# Patient Record
Sex: Female | Born: 1965 | Race: White | Hispanic: No | Marital: Married | Smoking: Former smoker
Health system: Southern US, Community
[De-identification: ages and names within clinical notes are randomized; demographics above are authoritative.]

## PROBLEM LIST (undated history)

## (undated) DIAGNOSIS — Z862 Personal history of diseases of the blood and blood-forming organs and certain disorders involving the immune mechanism: Secondary | ICD-10-CM

## (undated) DIAGNOSIS — R87629 Unspecified abnormal cytological findings in specimens from vagina: Secondary | ICD-10-CM

## (undated) DIAGNOSIS — D689 Coagulation defect, unspecified: Secondary | ICD-10-CM

## (undated) DIAGNOSIS — M1990 Unspecified osteoarthritis, unspecified site: Secondary | ICD-10-CM

## (undated) HISTORY — DX: Coagulation defect, unspecified: D68.9

## (undated) HISTORY — DX: Unspecified abnormal cytological findings in specimens from vagina: R87.629

## (undated) HISTORY — PX: TONSILLECTOMY: SUR1361

---

## 1990-05-14 HISTORY — PX: SPLENECTOMY: SUR1306

## 2008-04-02 ENCOUNTER — Inpatient Hospital Stay: Payer: Self-pay

## 2011-09-27 DIAGNOSIS — D6949 Other primary thrombocytopenia: Secondary | ICD-10-CM | POA: Insufficient documentation

## 2013-09-28 DIAGNOSIS — N924 Excessive bleeding in the premenopausal period: Secondary | ICD-10-CM | POA: Insufficient documentation

## 2015-03-17 DIAGNOSIS — F330 Major depressive disorder, recurrent, mild: Secondary | ICD-10-CM | POA: Insufficient documentation

## 2018-02-06 DIAGNOSIS — R5383 Other fatigue: Secondary | ICD-10-CM | POA: Insufficient documentation

## 2018-07-09 DIAGNOSIS — M25549 Pain in joints of unspecified hand: Secondary | ICD-10-CM | POA: Insufficient documentation

## 2018-07-09 DIAGNOSIS — L989 Disorder of the skin and subcutaneous tissue, unspecified: Secondary | ICD-10-CM | POA: Insufficient documentation

## 2018-07-09 DIAGNOSIS — N951 Menopausal and female climacteric states: Secondary | ICD-10-CM | POA: Insufficient documentation

## 2018-11-17 DIAGNOSIS — M189 Osteoarthritis of first carpometacarpal joint, unspecified: Secondary | ICD-10-CM | POA: Insufficient documentation

## 2018-11-17 DIAGNOSIS — M545 Low back pain, unspecified: Secondary | ICD-10-CM | POA: Insufficient documentation

## 2018-11-18 LAB — HIV ANTIBODY (ROUTINE TESTING W REFLEX): HIV 1&2 Ab, 4th Generation: NEGATIVE

## 2020-02-09 DIAGNOSIS — E559 Vitamin D deficiency, unspecified: Secondary | ICD-10-CM | POA: Insufficient documentation

## 2020-02-11 DIAGNOSIS — D72829 Elevated white blood cell count, unspecified: Secondary | ICD-10-CM | POA: Insufficient documentation

## 2020-02-29 ENCOUNTER — Telehealth: Payer: Self-pay

## 2020-02-29 NOTE — Telephone Encounter (Signed)
Called and left generic message for patient to call back to be scheduled 

## 2020-02-29 NOTE — Telephone Encounter (Signed)
Pt calling triage today, needing an appt for heavy vaginal bleeding and possibly being anemic. Can you please help get this pt scheduled

## 2020-03-14 LAB — HM MAMMOGRAPHY

## 2020-04-18 ENCOUNTER — Encounter: Payer: Self-pay | Admitting: Family Medicine

## 2020-04-18 ENCOUNTER — Ambulatory Visit (INDEPENDENT_AMBULATORY_CARE_PROVIDER_SITE_OTHER): Payer: BC Managed Care – PPO | Admitting: Family Medicine

## 2020-04-18 ENCOUNTER — Other Ambulatory Visit: Payer: Self-pay

## 2020-04-18 DIAGNOSIS — Z7689 Persons encountering health services in other specified circumstances: Secondary | ICD-10-CM | POA: Diagnosis not present

## 2020-04-18 DIAGNOSIS — D72829 Elevated white blood cell count, unspecified: Secondary | ICD-10-CM

## 2020-04-18 NOTE — Progress Notes (Signed)
Subjective:    Patient ID: Teresa Abbott, female    DOB: March 11, 1966, 54 y.o.   MRN: [id number]  Teresa Abbott is a 54 y.o. female presenting on 04/18/2020 for Establish Care   HPI  Teresa Abbott presents to clinic to establish as a new patient for primary care.  Previous PCP was at [place].  Records will be requested.  Past medical, family, and surgical history reviewed w/ pt.  She has no acute concerns today.  No flowsheet data found.  Social History   Tobacco Use  . Smoking status: Former Smoker    Years: 8.00    Quit date: 04/18/1990    Years since quitting: 30.0  . Smokeless tobacco: Never Used  Vaping Use  . Vaping Use: Never used  Substance Use Topics  . Alcohol use: Yes    Alcohol/week: 2.0 standard drinks    Types: 2 Standard drinks or equivalent per week  . Drug use: Not Currently    Types: Marijuana    Review of Systems  Constitutional: Negative.   HENT: Negative.   Eyes: Negative.   Respiratory: Negative.   Cardiovascular: Negative.   Gastrointestinal: Negative.   Endocrine: Negative.   Genitourinary: Negative.   Musculoskeletal: Negative.   Skin: Negative.   Allergic/Immunologic: Negative.   Neurological: Negative.   Hematological: Negative.   Psychiatric/Behavioral: Negative.    Per HPI unless specifically indicated above     Objective:    BP 113/60 (BP Location: Right Arm, Patient Position: Sitting, Cuff Size: Normal)   Pulse 76   Temp 98.7 F (37.1 C) (Oral)   Resp 17   Ht 5' 9.5" (1.765 m)   Wt 205 lb 9.6 oz (93.3 kg)   LMP 04/04/2020   SpO2 98%   BMI 29.93 kg/m   Wt Readings from Last 3 Encounters:  04/18/20 205 lb 9.6 oz (93.3 kg)    Physical Exam Vitals and nursing note reviewed.  Constitutional:      General: She is not in acute distress.    Appearance: Normal appearance. She is well-developed and well-groomed. She is not ill-appearing or toxic-appearing.  HENT:     Head: Normocephalic and atraumatic.     Nose:      Comments: Lesia Sago is in place, covering mouth and nose. Eyes:     General: Lids are normal. Vision grossly intact.        Right eye: No discharge.        Left eye: No discharge.     Extraocular Movements: Extraocular movements intact.     Conjunctiva/sclera: Conjunctivae normal.     Pupils: Pupils are equal, round, and reactive to light.  Cardiovascular:     Rate and Rhythm: Normal rate and regular rhythm.     Pulses: Normal pulses.     Heart sounds: Normal heart sounds. No murmur heard.  No friction rub. No gallop.   Pulmonary:     Effort: Pulmonary effort is normal. No respiratory distress.     Breath sounds: Normal breath sounds.  Skin:    General: Skin is warm and dry.     Capillary Refill: Capillary refill takes less than 2 seconds.  Neurological:     General: No focal deficit present.     Mental Status: She is alert and oriented to person, place, and time.  Psychiatric:        Attention and Perception: Attention and perception normal.        Mood and Affect: Mood and affect normal.  Speech: Speech normal.        Behavior: Behavior normal. Behavior is cooperative.        Thought Content: Thought content normal.        Cognition and Memory: Cognition and memory normal.        Judgment: Judgment normal.    No results found for this or any previous visit.    Assessment & Plan:   Problem List Items Addressed This Visit      Other   Encounter to establish care with new doctor    New patient establishment at [facility] for primary care.  Will request copies of previous medical records, reports last PAP testing was insufficient, will complete at CPE.  Reports she has received a referral to hematology with [facility] and has her first upcoming appointment late January 2022 for history of elevated WBC x 3.  Will RTC in 4 weeks for labs, CPE and PAP testing.         No orders of the defined types were placed in this encounter.  Follow up plan: Return in about 4 weeks  (around 05/16/2020) for CPE, PAP & Labs.   Charlaine Dalton, FNP Family Nurse Practitioner [facility] 04/18/2020, 1:00 PM

## 2020-04-18 NOTE — Assessment & Plan Note (Addendum)
New patient establishment at [facility] for primary care.  Will request copies of previous medical records, reports last PAP testing was insufficient, will complete at CPE.  Reports she has received a referral to hematology with [facility] and has her first upcoming appointment late January 2022 for history of elevated WBC x 3.  Will RTC in 4 weeks for labs, CPE and PAP testing.

## 2020-04-18 NOTE — Patient Instructions (Signed)
We will plan to see you back in 4 weeks for your physical, labs and PAP testing  You will receive a survey after today's visit either digitally by e-mail or paper by USPS mail. Your experiences and feedback matter to [place].  Please respond so we know how we are doing as we provide care for you.  Call us with any questions/concerns/needs.  It is my goal to be available to you for your health concerns.  Thanks for choosing me to be a partner in your healthcare needs!  Charlaine Dalton, FNP-C Family Nurse Practitioner [facility] Phone: [phone]

## 2020-05-19 ENCOUNTER — Ambulatory Visit (INDEPENDENT_AMBULATORY_CARE_PROVIDER_SITE_OTHER): Payer: BC Managed Care – PPO | Admitting: Family Medicine

## 2020-05-19 ENCOUNTER — Other Ambulatory Visit (HOSPITAL_COMMUNITY)
Admission: RE | Admit: 2020-05-19 | Discharge: 2020-05-19 | Disposition: A | Discharge status: Home / Self Care | Payer: BC Managed Care – PPO | Source: Ambulatory Visit | Attending: Family Medicine | Admitting: Family Medicine

## 2020-05-19 ENCOUNTER — Other Ambulatory Visit: Payer: Self-pay

## 2020-05-19 ENCOUNTER — Encounter: Payer: Self-pay | Admitting: Family Medicine

## 2020-05-19 VITALS — BP 106/45 | HR 59 | Temp 97.1°F | Resp 18 | Ht 69.5 in | Wt 208.4 lb

## 2020-05-19 DIAGNOSIS — Z0000 Encounter for general adult medical examination without abnormal findings: Secondary | ICD-10-CM | POA: Insufficient documentation

## 2020-05-19 DIAGNOSIS — Z124 Encounter for screening for malignant neoplasm of cervix: Secondary | ICD-10-CM | POA: Diagnosis not present

## 2020-05-19 DIAGNOSIS — Z1211 Encounter for screening for malignant neoplasm of colon: Secondary | ICD-10-CM | POA: Diagnosis not present

## 2020-05-19 DIAGNOSIS — G8929 Other chronic pain: Secondary | ICD-10-CM

## 2020-05-19 DIAGNOSIS — M25552 Pain in left hip: Secondary | ICD-10-CM

## 2020-05-19 DIAGNOSIS — N926 Irregular menstruation, unspecified: Secondary | ICD-10-CM | POA: Diagnosis not present

## 2020-05-19 NOTE — Progress Notes (Signed)
Subjective:    Patient ID: Teresa Abbott, female    DOB: April 01, 1966, 55 y.o.   MRN: [id number]  Teresa Abbott is a 55 y.o. female presenting on 05/19/2020 for Annual Exam   HPI  HEALTH MAINTENANCE:  Weight/BMI: Obese, BMI 30.33% Diet: Regular Seatbelt: Yes Sunscreen: Sometimes, no skin concerns PAP: Due, completed today  Mammogram: Completed 03/14/2020 - BI-RADS 1 Negative, Repeat 1 year with [facility] Colon cancer screening: Cologuard ordered HIV & Hep C Screening: Offered and declined  GC/CT: Offered and declined  Optometry: Yearly  Dentistry: Every 6 months   IMMUNIZATIONS: Tetanus: Unknown date of last vaccination, discussed Influenza: Up to date, 04/08/2020 COVID: Up to date, [place] 08/17/2019, 09/14/2019, and 04/08/2020  Depression screen PHQ 2/9 05/19/2020  Decreased Interest 0  Down, Depressed, Hopeless 0  PHQ - 2 Score 0    Past Medical History:  Diagnosis Date  . Abnormal vaginal Pap smear    Past Surgical History:  Procedure Laterality Date  . SPLENECTOMY  1992  . TONSILLECTOMY     Social History   Socioeconomic History  . Marital status: Married    Spouse name: Not on file  . Number of children: Not on file  . Years of education: Not on file  . Highest education level: Not on file  Occupational History  . Not on file  Tobacco Use  . Smoking status: Former Smoker    Years: 8.00    Quit date: 04/18/1990    Years since quitting: 30.1  . Smokeless tobacco: Never Used  Vaping Use  . Vaping Use: Never used  Substance and Sexual Activity  . Alcohol use: Yes    Alcohol/week: 2.0 standard drinks    Types: 2 Standard drinks or equivalent per week  . Drug use: Not Currently    Types: Marijuana  . Sexual activity: Not on file  Other Topics Concern  . Not on file  Social History Narrative  . Not on file   Social Determinants of Health   Financial Resource Strain: Not on file  Food Insecurity: Not on file  Transportation Needs: Not on file   Physical Activity: Not on file  Stress: Not on file  Social Connections: Not on file  Intimate Partner Violence: Not on file   Family History  Problem Relation Age of Onset  . Breast cancer Mother 20  . Heart disease Brother    Current Outpatient Medications on File Prior to Visit  Medication Sig  . Black Cohosh 20 MG TABS Take by mouth.  . Cholecalciferol (VITAMIN D3) 125 MCG (5000 UT) CAPS Take by mouth.  . Levonorgestrel-Ethinyl Estradiol (AMETHIA) 0.1-0.02 & 0.01 MG tablet Take 1 tablet by mouth at bedtime.  . meloxicam (MOBIC) 15 MG tablet Take 15 mg by mouth daily as needed.  . sertraline (ZOLOFT) 25 MG tablet Take by mouth.  . vitamin B-12 (CYANOCOBALAMIN) 500 MCG tablet Take 500 mcg by mouth daily.   No current facility-administered medications on file prior to visit.    Per HPI unless specifically indicated above     Objective:    BP (!) 106/45 (BP Location: Left Arm, Patient Position: Sitting, Cuff Size: Normal)   Pulse (!) 59   Temp (!) 97.1 F (36.2 C) (Temporal)   Resp 18   Ht 5' 9.5" (1.765 m)   Wt 208 lb 6.4 oz (94.5 kg)   LMP 05/09/2020   SpO2 100%   BMI 30.33 kg/m   Wt Readings from Last 3 Encounters:  05/19/20 208 lb 6.4 oz (94.5 kg)  04/18/20 205 lb 9.6 oz (93.3 kg)    Physical Exam Vitals and nursing note reviewed.  Constitutional:      General: She is not in acute distress.    Appearance: Normal appearance. She is well-developed and well-groomed. She is obese. She is not ill-appearing or toxic-appearing.  HENT:     Head: Normocephalic and atraumatic.     Right Ear: Tympanic membrane, ear canal and external ear normal. There is no impacted cerumen.     Left Ear: Tympanic membrane, ear canal and external ear normal. There is no impacted cerumen.     Nose: Nose normal. No congestion or rhinorrhea.     Mouth/Throat:     Lips: Pink.     Mouth: Mucous membranes are moist.     Pharynx: Oropharynx is clear. Uvula midline. No oropharyngeal exudate  or posterior oropharyngeal erythema.  Eyes:     General: Lids are normal. Vision grossly intact. No scleral icterus.       Right eye: No discharge.        Left eye: No discharge.     Extraocular Movements: Extraocular movements intact.     Conjunctiva/sclera: Conjunctivae normal.     Pupils: Pupils are equal, round, and reactive to light.  Neck:     Thyroid: No thyroid mass or thyromegaly.  Cardiovascular:     Rate and Rhythm: Normal rate and regular rhythm.     Pulses: Normal pulses.          Dorsalis pedis pulses are 2+ on the right side and 2+ on the left side.     Heart sounds: Normal heart sounds. No murmur heard. No friction rub. No gallop.   Pulmonary:     Effort: Pulmonary effort is normal. No respiratory distress.     Breath sounds: Normal breath sounds.  Abdominal:     General: Abdomen is flat. Bowel sounds are normal. There is no distension.     Palpations: Abdomen is soft. There is no hepatomegaly, splenomegaly or mass.     Tenderness: There is no abdominal tenderness. There is no guarding or rebound.     Hernia: No hernia is present.  Musculoskeletal:        General: Normal range of motion.     Cervical back: Normal range of motion and neck supple. No tenderness.     Right hip: Normal.     Left hip: Normal.     Right lower leg: No edema.     Left lower leg: No edema.       Legs:     Comments: Normal tone, strength 5/5 BUE & BLE  Feet:     Right foot:     Skin integrity: Skin integrity normal.     Left foot:     Skin integrity: Skin integrity normal.  Lymphadenopathy:     Cervical: No cervical adenopathy.  Skin:    General: Skin is warm and dry.     Capillary Refill: Capillary refill takes less than 2 seconds.  Neurological:     General: No focal deficit present.     Mental Status: She is alert and oriented to person, place, and time.     Cranial Nerves: No cranial nerve deficit.     Sensory: No sensory deficit.     Motor: No weakness.     Coordination:  Coordination normal.     Gait: Gait normal.     Deep Tendon Reflexes: Reflexes normal.  Psychiatric:        Attention and Perception: Attention and perception normal.        Mood and Affect: Mood and affect normal.        Speech: Speech normal.        Behavior: Behavior normal. Behavior is cooperative.        Thought Content: Thought content normal.        Cognition and Memory: Cognition and memory normal.        Judgment: Judgment normal.     Results for orders placed or performed in visit on 05/19/20  HM MAMMOGRAPHY  Result Value Ref Range   HM Mammogram 0-4 Bi-Rad 0-4 Bi-Rad, Self Reported Normal      Assessment & Plan:   Problem List Items Addressed This Visit      Other   Annual physical exam - Primary    Annual physical exam without new findings.  Well adult with acute concerns for abnormal menses and left hip pain.  Plan: 1. Obtain health maintenance screenings as above according to age. - Increase physical activity to 30 minutes most days of the week.  - Eat healthy diet high in vegetables and fruits; low in refined carbohydrates. - Screening labs and tests as ordered 2. Return 1 year for annual physical.       Relevant Orders   CBC with Differential   COMPLETE METABOLIC PANEL WITH GFR   Lipid Profile   TSH + free T4   Abnormal menses    Reports has had abnormal menses over the past few year, has not gone a full year without a menses, at times she will have multiple heavy menstrual cycles per month.  Is carrying around feminine products due to uncertainty of when menstrual cycles will start.  Notes clots and heaviness when cycles are present.  Discussed referral to OB/GYN for evaluation and can place TV US for evaluation of uterus.  Patient in agreement.  Order placed and referral to OB/GYN.      Relevant Orders   Ambulatory referral to Gynecology   US Transvaginal Non-OB   Hip pain, chronic, left    Reports years of left hip pain that has caused left knee and  left ankle pain.  Has met with [facility] in the past, discussed possibility of needing further imaging due to persistence of symptoms.  Will place referral to return to [facility].      Relevant Medications   meloxicam (MOBIC) 15 MG tablet   Other Relevant Orders   AMB referral to orthopedics   Colon cancer screening    Pt requiring colon cancer screening.  Denies family history of colon cancer.  Plan: - Discussed timing for initiation of colon cancer screening ACS vs USPSTF guidelines - Mutual decision making discussion for options of colonoscopy vs cologuard.  Pt prefers cologuard. - Ordered Cologuard today       Relevant Orders   Cologuard   Cervical cancer screening    Last PAP testing unknown.  Due for PAP testing.  Plan: 1. PAP testing completed and sent to lab for evaluation        Relevant Orders   Cytology - PAP      No orders of the defined types were placed in this encounter.  Follow up plan: Return in about 1 year (around 05/19/2021) for CPE.  Harlin Rain, FNP-C Family Nurse Practitioner [facility] 05/19/2020, 10:40 AM

## 2020-05-19 NOTE — Assessment & Plan Note (Signed)
Pt requiring colon cancer screening.  Denies family history of colon cancer.  Plan: - Discussed timing for initiation of colon cancer screening ACS vs USPSTF guidelines - Mutual decision making discussion for options of colonoscopy vs cologuard.  Pt prefers cologuard. - Ordered Cologuard today 

## 2020-05-19 NOTE — Assessment & Plan Note (Signed)
Annual physical exam without new findings.  Well adult with acute concerns for abnormal menses and left hip pain.  Plan: 1. Obtain health maintenance screenings as above according to age. - Increase physical activity to 30 minutes most days of the week.  - Eat healthy diet high in vegetables and fruits; low in refined carbohydrates. - Screening labs and tests as ordered 2. Return 1 year for annual physical.

## 2020-05-19 NOTE — Assessment & Plan Note (Signed)
Last PAP testing unknown.  Due for PAP testing.  Plan: 1. PAP testing completed and sent to lab for evaluation  

## 2020-05-19 NOTE — Assessment & Plan Note (Signed)
Reports has had abnormal menses over the past few year, has not gone a full year without a menses, at times she will have multiple heavy menstrual cycles per month.  Is carrying around feminine products due to uncertainty of when menstrual cycles will start.  Notes clots and heaviness when cycles are present.  Discussed referral to OB/GYN for evaluation and can place TV US for evaluation of uterus.  Patient in agreement.  Order placed and referral to OB/GYN.

## 2020-05-19 NOTE — Assessment & Plan Note (Signed)
Reports years of left hip pain that has caused left knee and left ankle pain.  Has met with [facility] in the past, discussed possibility of needing further imaging due to persistence of symptoms.  Will place referral to return to [facility].

## 2020-05-19 NOTE — Patient Instructions (Signed)
Have your labs drawn and we will contact you with the results.  We have sent your PAP to the lab for testing.  Will contact you once the results are received.  A referral to OBGYN and for a transvaginal ultrasound has been placed today.  If you have not heard from the specialty office or our referral coordinator within 1 week, please let us know and we will follow up with the referral coordinator for an update.  A referral to Orthopedics for your chronic left hip/lower back pain has been placed today.  If you have not heard from the specialty office or our referral coordinator within 1 week, please let us know and we will follow up with the referral coordinator for an update.  I have put in an order for Cologuard, they will be shipping the cologuard kit directly to your home.  We will plan to see you back in 12 months for your physical  You will receive a survey after today's visit either digitally by e-mail or paper by [place] mail. Your experiences and feedback matter to [place].  Please respond so we know how we are doing as we provide care for you.  Call us with any questions/concerns/needs.  It is my goal to be available to you for your health concerns.  Thanks for choosing me to be a partner in your healthcare needs!  Harlin Rain, FNP-C Family Nurse Practitioner [facility] Phone: [phone]

## 2020-05-20 LAB — COMPLETE METABOLIC PANEL WITH GFR
AG Ratio: 1.2 (calc) (ref 1.0–2.5)
ALT: 19 U/L (ref 6–29)
AST: 18 U/L (ref 10–35)
Albumin: 4.2 g/dL (ref 3.6–5.1)
Alkaline phosphatase (APISO): 77 U/L (ref 37–153)
BUN: 12 mg/dL (ref 7–25)
CO2: 28 mmol/L (ref 20–32)
Calcium: 9.9 mg/dL (ref 8.6–10.4)
Chloride: 103 mmol/L (ref 98–110)
Creat: 0.61 mg/dL (ref 0.50–1.05)
GFR, Est African American: 121 mL/min/{1.73_m2} (ref >=60)
GFR, Est Non African American: 104 mL/min/{1.73_m2} (ref >=60)
Globulin: 3.4 g/dL (calc) (ref 1.9–3.7)
Glucose, Bld: 81 mg/dL (ref 65–99)
Potassium: 5 mmol/L (ref 3.5–5.3)
Sodium: 139 mmol/L (ref 135–146)
Total Bilirubin: 0.8 mg/dL (ref 0.2–1.2)
Total Protein: 7.6 g/dL (ref 6.1–8.1)

## 2020-05-20 LAB — CBC WITH DIFFERENTIAL/PLATELET
Absolute Monocytes: 618 cells/uL (ref 200–950)
Basophils Absolute: 86 cells/uL (ref 0–200)
Basophils Relative: 0.9 %
Eosinophils Absolute: 171 cells/uL (ref 15–500)
Eosinophils Relative: 1.8 %
HCT: 40.4 % (ref 35.0–45.0)
Hemoglobin: 13.7 g/dL (ref 11.7–15.5)
Lymphs Abs: 2328 cells/uL (ref 850–3900)
MCH: 31.4 pg (ref 27.0–33.0)
MCHC: 33.9 g/dL (ref 32.0–36.0)
MCV: 92.7 fL (ref 80.0–100.0)
MPV: 13.2 fL — ABNORMAL HIGH (ref 7.5–12.5)
Monocytes Relative: 6.5 %
Neutro Abs: 6299 cells/uL (ref 1500–7800)
Neutrophils Relative %: 66.3 %
Platelets: 402 10*3/uL — ABNORMAL HIGH (ref 140–400)
RBC: 4.36 10*6/uL (ref 3.80–5.10)
RDW: 11.8 % (ref 11.0–15.0)
Total Lymphocyte: 24.5 %
WBC: 9.5 10*3/uL (ref 3.8–10.8)

## 2020-05-20 LAB — LIPID PANEL
Cholesterol: 205 mg/dL — ABNORMAL HIGH (ref <200)
HDL: 55 mg/dL (ref >=50)
LDL Cholesterol (Calc): 130 mg/dL (calc) — ABNORMAL HIGH
Non-HDL Cholesterol (Calc): 150 mg/dL (calc) — ABNORMAL HIGH (ref <130)
Total CHOL/HDL Ratio: 3.7 (calc) (ref <5.0)
Triglycerides: 102 mg/dL (ref <150)

## 2020-05-20 LAB — TSH+FREE T4: TSH W/REFLEX TO FT4: 1.24 mIU/L

## 2020-05-25 ENCOUNTER — Other Ambulatory Visit: Payer: Self-pay

## 2020-05-25 ENCOUNTER — Ambulatory Visit
Admission: RE | Admit: 2020-05-25 | Discharge: 2020-05-25 | Disposition: A | Discharge status: Home / Self Care | Payer: BC Managed Care – PPO | Source: Ambulatory Visit | Attending: Family Medicine | Admitting: Family Medicine

## 2020-05-25 DIAGNOSIS — N926 Irregular menstruation, unspecified: Secondary | ICD-10-CM | POA: Diagnosis present

## 2020-05-25 LAB — CYTOLOGY - PAP
Comment: NEGATIVE
Diagnosis: NEGATIVE
High risk HPV: NEGATIVE

## 2020-06-03 ENCOUNTER — Encounter: Payer: Self-pay | Admitting: Obstetrics and Gynecology

## 2020-06-03 ENCOUNTER — Other Ambulatory Visit: Payer: Self-pay

## 2020-06-03 ENCOUNTER — Ambulatory Visit (INDEPENDENT_AMBULATORY_CARE_PROVIDER_SITE_OTHER): Payer: BC Managed Care – PPO | Admitting: Obstetrics and Gynecology

## 2020-06-03 DIAGNOSIS — N939 Abnormal uterine and vaginal bleeding, unspecified: Secondary | ICD-10-CM | POA: Diagnosis not present

## 2020-06-03 DIAGNOSIS — N840 Polyp of corpus uteri: Secondary | ICD-10-CM

## 2020-06-03 NOTE — Progress Notes (Signed)
I connected with Teresa Abbott on 06/03/2020 at  2:30 PM EST by telephone and verified that I am speaking with the correct person using two identifiers.   I discussed the limitations, risks, security and privacy concerns of performing an evaluation and management service by telephone and the availability of in person appointments. I also discussed with the patient that there may be a patient responsible charge related to this service. The patient expressed understanding and agreed to proceed.  The patient was at home I spoke with the patient from my workstation phone The names of people involved in this encounter were: Teresa Abbott , and Teresa Abbott   Obstetrics & Gynecology Office Visit   Chief Complaint:  Chief Complaint  Patient presents with  . Metrorrhagia    Phone visit - x 6 months, menses comes and goes, sometime very light, sometimes heavy with large clots.    History of Present Illness: 55 y.o. female presenting for abnormal uterine bleeding.   Symptoms were first brought to attention to her PCP 05/19/2020 and per patient reporting heavy menstrual bleeding over the past 6 months.  Pap smear is up to date NILM, HPV negative on 05/19/2020.  Laboratory work up revealed a normal H&H.  However, imaging revealed a focal endometrial lesion and the fundus of the uterus.    Review of Systems: Review of Systems  Constitutional: Negative.   Gastrointestinal: Negative.   Endo/Heme/Allergies: Does not bruise/bleed easily.     Past Medical History:  Past Medical History:  Diagnosis Date  . Abnormal vaginal Pap smear     Past Surgical History:  Past Surgical History:  Procedure Laterality Date  . SPLENECTOMY  1992  . TONSILLECTOMY      Gynecologic History: Patient's last menstrual period was 05/09/2020.  Obstetric History: No obstetric history on file.  Family History:  Family History  Problem Relation Age of Onset  . Breast cancer Mother 38  . Heart disease  Brother     Social History:  Social History   Socioeconomic History  . Marital status: Married    Spouse name: Not on file  . Number of children: Not on file  . Years of education: Not on file  . Highest education level: Not on file  Occupational History  . Not on file  Tobacco Use  . Smoking status: Former Smoker    Years: 8.00    Quit date: 04/18/1990    Years since quitting: 30.2  . Smokeless tobacco: Never Used  Vaping Use  . Vaping Use: Never used  Substance and Sexual Activity  . Alcohol use: Yes    Alcohol/week: 2.0 standard drinks    Types: 2 Standard drinks or equivalent per week  . Drug use: Not Currently    Types: Marijuana  . Sexual activity: Yes    Birth control/protection: Pill  Other Topics Concern  . Not on file  Social History Narrative  . Not on file   Social Determinants of Health   Financial Resource Strain: Not on file  Food Insecurity: Not on file  Transportation Needs: Not on file  Physical Activity: Not on file  Stress: Not on file  Social Connections: Not on file  Intimate Partner Violence: Not on file    Allergies:  No Known Allergies  Medications: Prior to Admission medications   Medication Sig Start Date End Date Taking? Authorizing Provider  Black Cohosh 20 MG TABS Take by mouth.   Yes [provider]  Cholecalciferol (VITAMIN  D3) 125 MCG (5000 UT) CAPS Take by mouth.   Yes [provider]  Levonorgestrel-Ethinyl Estradiol (AMETHIA) 0.1-0.02 & 0.01 MG tablet Take 1 tablet by mouth at bedtime. 01/25/20  Yes [provider]  meloxicam (MOBIC) 15 MG tablet Take 15 mg by mouth daily as needed.   Yes [provider]  sertraline (ZOLOFT) 25 MG tablet Take by mouth.   Yes [provider]  vitamin B-12 (CYANOCOBALAMIN) 500 MCG tablet Take 500 mcg by mouth daily.   Yes [provider]    Physical Exam Vitals: There were no vitals filed for this visit. Patient's last menstrual period  was 05/09/2020.  No physical exam as this was a remote telephone visit to promote social distancing during the current COVID-19 Pandemic  Assessment: 55 y.o. with endometrial polyp  Plan: Problem List Items Addressed This Visit   None   Visit Diagnoses    Endometrial polyp    -  Primary   Abnormal uterine bleeding          1) We discussed that menopause is a clinical diagnosis made after 12 months of amenorrhea.  The average age of menopause in the  [place] population is 78 but there may be significant variation.  Any bleeding that happens after a 12 month period of amenorrhea warrants further work.  Possible etiologies of postmenopausal bleeding were discussed with the patient today.  These may range from benign etiologies such as urethral prolapse and atrophy, to indeterminate lesions such as submucosal fibroids or polyps which would require resection to accurately evaluate. The role of unopposed estrogen in the development of endometrial hyperplasia or carcinoma is discussed.  The risk of endometrial hyperplasia is linearly correlated with increasing BMI given the production of estrone by adipose tissue.  Work up will be include transvaginal ultrasound to assess the thickness of the endometrial lining as well as to assess for focal uterine lesions.  Negative ultrasound evaluation, defined as the absence of focal lesions and endometrial stripe of <57mm, effectively rules out carcinoma and confirms atrophy as the most likely etiology.  Should focal lesions be present these generally require hysteroscopic resection.  Should lining be greater >82mm endometrial biopsy is warranted to rule out hyperplasia or frank endometrial cancer.  Continued episodes of bleeding despite negative ultrasound also warrant endometrial sampling.  As the cervical pathology may also be implicated in postmenopausal bleeding prior cervical cytology was reviewed and repeated if required per ASCCP guidelines.  - given focal  lesion on work up today would recommend proceeding with hysteroscopy D&C for removal and further classificiation   2) Telephone time 16:25   Teresa Austria, MD, [facility], [facility] 06/03/2020, 2:54 PM

## 2020-06-08 DIAGNOSIS — Z9081 Acquired absence of spleen: Secondary | ICD-10-CM | POA: Insufficient documentation

## 2020-06-21 ENCOUNTER — Telehealth: Payer: Self-pay

## 2020-06-21 LAB — COLOGUARD: Cologuard: NEGATIVE

## 2020-06-21 NOTE — Telephone Encounter (Signed)
-----   Message from [name], MD sent at 06/14/2020 10:37 AM EST ----- Regarding: Surgery Surgery Booking Request Patient Full Name:  Teresa Abbott  MRN: [id number]  DOB: Sep 26, 1965  Surgeon: [name], MD  Requested Surgery Date and Time: 1-4 weeks Primary Diagnosis AND Code: Endometrial polyp Secondary Diagnosis and Code: Abnormal uterine bleeding Surgical Procedure: Hysteroscopy D&C RNFA Requested?: No L&D Notification: No Admission Status: same day surgery Length of Surgery: 50 min Special Case Needs: No H&P: Yes Phone Interview???:  Yes Interpreter: No Medical Clearance:  No Special Scheduling Instructions: No Any known health/anesthesia issues, diabetes, sleep apnea, latex allergy, defibrillator/pacemaker?: No Acuity: P3   (P1 highest, P2 delay may cause harm, P3 low, elective gyn, P4 lowest)

## 2020-06-21 NOTE — Telephone Encounter (Signed)
Called patient to schedule Hysteroscopy D&C w [facility]  DOS 2/17  H&P 2/14 @ 8:10   Covid testing 2/9 @ 8:00, [facility] [employer], drive up and wear mask.Pt did home test 06/03/20 and was positive but we have no record on file. I adv that she needed to go ahead and test tomorrow so we can see her results. If positive, she will have to wait 10 days to schedule procedure. If negative, she will need to re-test prior to DOS to confirm she has remained negative. Pt understands this process.  Pre-admit phone call appointment to be requested - date and time will be included on H&P paper work. Also all appointments will be updated on pt MyChart. Explained that this appointment has a call window. Based on the time scheduled will indicate if the call will be received within a 4 hour window before 1:00 or after.  Advised that pt may also receive calls from the hospital pharmacy and pre-service center.  Confirmed pt has [employer] as Editor, commissioning. No secondary insurance.

## 2020-06-22 ENCOUNTER — Other Ambulatory Visit: Payer: Self-pay

## 2020-06-22 ENCOUNTER — Other Ambulatory Visit
Admission: RE | Admit: 2020-06-22 | Discharge: 2020-06-22 | Disposition: A | Discharge status: Home / Self Care | Payer: BC Managed Care – PPO | Source: Ambulatory Visit | Attending: Obstetrics and Gynecology | Admitting: Obstetrics and Gynecology

## 2020-06-22 DIAGNOSIS — Z01812 Encounter for preprocedural laboratory examination: Secondary | ICD-10-CM | POA: Insufficient documentation

## 2020-06-22 DIAGNOSIS — Z20822 Contact with and (suspected) exposure to covid-19: Secondary | ICD-10-CM | POA: Diagnosis not present

## 2020-06-22 LAB — SARS CORONAVIRUS 2 (TAT 6-24 HRS): SARS Coronavirus 2: NEGATIVE

## 2020-06-23 ENCOUNTER — Other Ambulatory Visit: Payer: Self-pay | Admitting: Obstetrics and Gynecology

## 2020-06-25 LAB — COLOGUARD
COLOGUARD: NEGATIVE
Cologuard: NEGATIVE

## 2020-06-27 ENCOUNTER — Ambulatory Visit (INDEPENDENT_AMBULATORY_CARE_PROVIDER_SITE_OTHER): Payer: BC Managed Care – PPO | Admitting: Obstetrics and Gynecology

## 2020-06-27 ENCOUNTER — Encounter: Payer: Self-pay | Admitting: Obstetrics and Gynecology

## 2020-06-27 ENCOUNTER — Other Ambulatory Visit: Payer: Self-pay

## 2020-06-27 VITALS — BP 110/60 | Wt 209.0 lb

## 2020-06-27 DIAGNOSIS — N840 Polyp of corpus uteri: Secondary | ICD-10-CM

## 2020-06-27 DIAGNOSIS — Z01818 Encounter for other preprocedural examination: Secondary | ICD-10-CM | POA: Diagnosis not present

## 2020-06-27 NOTE — H&P (View-Only) (Signed)
Obstetrics & Gynecology Surgery H&P    Chief Complaint: Scheduled Surgery   History of Present Illness: Patient is a 55 y.o. No obstetric history on file. presenting for scheduled hysterosocpy D&C, for the treatment or further evaluation of endometrial polyp.   Prior Treatments prior to proceeding with surgery include: imaging  Preoperative Pap: 05/19/2020 NILM HPV negative Preoperative Endometrial biopsy: N/A focal lesion Preoperative Ultrasound: Ovaries not visualized, the uterus does not show evidence of fibroids.  The endometrial stripe is thickened at 1.3cm with focal thickening at the fundus measuring 1.3cm with associated vascularity   Review of Systems:10 point review of systems  Past Medical History:  Patient Active Problem List   Diagnosis Date Noted  . Annual physical exam 05/19/2020  . Abnormal menses 05/19/2020  . Hip pain, chronic, left 05/19/2020  . Colon cancer screening 05/19/2020  . Cervical cancer screening 05/19/2020  . Encounter to establish care with new doctor 04/18/2020    Past Surgical History:  Past Surgical History:  Procedure Laterality Date  . SPLENECTOMY  1992  . TONSILLECTOMY      Family History:  Family History  Problem Relation Age of Onset  . Breast cancer Mother 74  . Heart disease Brother     Social History:  Social History   Socioeconomic History  . Marital status: Married    Spouse name: Not on file  . Number of children: Not on file  . Years of education: Not on file  . Highest education level: Not on file  Occupational History  . Not on file  Tobacco Use  . Smoking status: Former Smoker    Years: 8.00    Quit date: 04/18/1990    Years since quitting: 30.2  . Smokeless tobacco: Never Used  Vaping Use  . Vaping Use: Never used  Substance and Sexual Activity  . Alcohol use: Yes    Alcohol/week: 2.0 standard drinks    Types: 2 Standard drinks or equivalent per week  . Drug use: Not Currently    Types: Marijuana  .  Sexual activity: Yes    Birth control/protection: Pill  Other Topics Concern  . Not on file  Social History Narrative  . Not on file   Social Determinants of Health   Financial Resource Strain: Not on file  Food Insecurity: Not on file  Transportation Needs: Not on file  Physical Activity: Not on file  Stress: Not on file  Social Connections: Not on file  Intimate Partner Violence: Not on file    Allergies:  No Known Allergies  Medications: Prior to Admission medications   Medication Sig Start Date End Date Taking? Authorizing Provider  b complex vitamins capsule Take 1 capsule by mouth daily.    [provider]  Cyanocobalamin (B-12 PO) Take 1 tablet by mouth daily.    [provider]  Levonorgestrel-Ethinyl Estradiol (AMETHIA) 0.1-0.02 & 0.01 MG tablet Take 1 tablet by mouth daily. 01/25/20   [provider]  meloxicam (MOBIC) 15 MG tablet Take 15 mg by mouth daily.    [provider]  sertraline (ZOLOFT) 25 MG tablet Take 25 mg by mouth daily.    [provider]  VITAMIN D PO Take 1 capsule by mouth daily.    [provider]    Physical Exam Vitals: Weight 209 lb (94.8 kg). General: NAD HEENT: normocephalic, anicteric Pulmonary: No increased work of breathing, CTAB Cardiovascular: RRR, distal pulses 2+ Genitourinary: deferred Extremities: no edema, erythema, or tenderness Neurologic: Grossly intact  Psychiatric: mood appropriate, affect full  Imaging No results found.  Assessment: 55 y.o. No obstetric history on file. presenting for scheduled hysteroscopy D&C for endometrial polyp  Plan: 1) I have discussed with the patient the indications for the procedure. Included in the discussion were the options of therapy, as wall as their individual risks, benefits, and complications. Ample time was given to answer all questions.   In office pipelle biopsy generally provides comparable results to D&C, however this sampling  modality may miss focal lesions if these were previously documented on ultrasound.  It is because of the potential to miss focal lesions that hysteroscopy D&C is also warranted in patient with continued postmenopausal bleeding that is not self limited regardless of prior in office biopsy results or ultrasound findings.  She understands that the risk of continued observation include worsening bleeding or worsening of any underlying pathology.  The choices include: 1. Doing nothing but following her symptoms 2. Attempts at hormonal manipulation with either BCP or Depo-Provera for premenopausal patients with no concern for focal lesion or endometrial pathology 3. D&C/hysteroscopy. 4. Endometrial ablation via Novasure or other techniques for premenopausal patients with no concern for focal lesion or endometrial pathology  5. As final resort, hysterectomy. After consideration of her history and findings, mutual decision has been made to proceed with D+C/hysteroscopy. While the incidence is low, the risks from this surgery include, but are not limited to, the risks of anesthesia, hemorrhage, infection, perforation, and injury to adjacent structures including bowel, bladder and blood vessels.    2) Routine postoperative instructions were reviewed with the patient and her family in detail today including the expected length of recovery and likely postoperative course.  The patient concurred with the proposed plan, giving informed written consent for the surgery today.  Patient instructed on the importance of being NPO after midnight prior to her procedure.  If warranted preoperative prophylactic antibiotics and SCDs ordered on call to the OR to meet SCIP guidelines and adhere to recommendation laid forth in ACOG Practice Bulletin Number 104 May 2009  "Antibiotic Prophylaxis for Gynecologic Procedures".     Tamber Burtch, MD, FACOG [facility], [facility] 06/27/2020, 8:16 AM   

## 2020-06-27 NOTE — Progress Notes (Signed)
Obstetrics & Gynecology Surgery H&P    Chief Complaint: Scheduled Surgery   History of Present Illness: Patient is a 55 y.o. No obstetric history on file. presenting for scheduled hysterosocpy D&C, for the treatment or further evaluation of endometrial polyp.   Prior Treatments prior to proceeding with surgery include: imaging  Preoperative Pap: 05/19/2020 NILM HPV negative Preoperative Endometrial biopsy: N/A focal lesion Preoperative Ultrasound: Ovaries not visualized, the uterus does not show evidence of fibroids.  The endometrial stripe is thickened at 1.3cm with focal thickening at the fundus measuring 1.3cm with associated vascularity   Review of Systems:10 point review of systems  Past Medical History:  Patient Active Problem List   Diagnosis Date Noted  . Annual physical exam 05/19/2020  . Abnormal menses 05/19/2020  . Hip pain, chronic, left 05/19/2020  . Colon cancer screening 05/19/2020  . Cervical cancer screening 05/19/2020  . Encounter to establish care with new doctor 04/18/2020    Past Surgical History:  Past Surgical History:  Procedure Laterality Date  . SPLENECTOMY  1992  . TONSILLECTOMY      Family History:  Family History  Problem Relation Age of Onset  . Breast cancer Mother 74  . Heart disease Brother     Social History:  Social History   Socioeconomic History  . Marital status: Married    Spouse name: Not on file  . Number of children: Not on file  . Years of education: Not on file  . Highest education level: Not on file  Occupational History  . Not on file  Tobacco Use  . Smoking status: Former Smoker    Years: 8.00    Quit date: 04/18/1990    Years since quitting: 30.2  . Smokeless tobacco: Never Used  Vaping Use  . Vaping Use: Never used  Substance and Sexual Activity  . Alcohol use: Yes    Alcohol/week: 2.0 standard drinks    Types: 2 Standard drinks or equivalent per week  . Drug use: Not Currently    Types: Marijuana  .  Sexual activity: Yes    Birth control/protection: Pill  Other Topics Concern  . Not on file  Social History Narrative  . Not on file   Social Determinants of Health   Financial Resource Strain: Not on file  Food Insecurity: Not on file  Transportation Needs: Not on file  Physical Activity: Not on file  Stress: Not on file  Social Connections: Not on file  Intimate Partner Violence: Not on file    Allergies:  No Known Allergies  Medications: Prior to Admission medications   Medication Sig Start Date End Date Taking? Authorizing Provider  b complex vitamins capsule Take 1 capsule by mouth daily.    [provider]  Cyanocobalamin (B-12 PO) Take 1 tablet by mouth daily.    [provider]  Levonorgestrel-Ethinyl Estradiol (AMETHIA) 0.1-0.02 & 0.01 MG tablet Take 1 tablet by mouth daily. 01/25/20   [provider]  meloxicam (MOBIC) 15 MG tablet Take 15 mg by mouth daily.    [provider]  sertraline (ZOLOFT) 25 MG tablet Take 25 mg by mouth daily.    [provider]  VITAMIN D PO Take 1 capsule by mouth daily.    [provider]    Physical Exam Vitals: Weight 209 lb (94.8 kg). General: NAD HEENT: normocephalic, anicteric Pulmonary: No increased work of breathing, CTAB Cardiovascular: RRR, distal pulses 2+ Genitourinary: deferred Extremities: no edema, erythema, or tenderness Neurologic: Grossly intact  Psychiatric: mood appropriate, affect full  Imaging No results found.  Assessment: 55 y.o. No obstetric history on file. presenting for scheduled hysteroscopy D&C for endometrial polyp  Plan: 1) I have discussed with the patient the indications for the procedure. Included in the discussion were the options of therapy, as wall as their individual risks, benefits, and complications. Ample time was given to answer all questions.   In office pipelle biopsy generally provides comparable results to [facility], however this sampling  modality may miss focal lesions if these were previously documented on ultrasound.  It is because of the potential to miss focal lesions that hysteroscopy D&C is also warranted in patient with continued postmenopausal bleeding that is not self limited regardless of prior in office biopsy results or ultrasound findings.  She understands that the risk of continued observation include worsening bleeding or worsening of any underlying pathology.  The choices include: 1. Doing nothing but following her symptoms 2. Attempts at hormonal manipulation with either BCP or Depo-Provera for premenopausal patients with no concern for focal lesion or endometrial pathology 3. D&C/hysteroscopy. 4. Endometrial ablation via Novasure or other techniques for premenopausal patients with no concern for focal lesion or endometrial pathology  5. As final resort, hysterectomy. After consideration of her history and findings, mutual decision has been made to proceed with D+C/hysteroscopy. While the incidence is low, the risks from this surgery include, but are not limited to, the risks of anesthesia, hemorrhage, infection, perforation, and injury to adjacent structures including bowel, bladder and blood vessels.    2) Routine postoperative instructions were reviewed with the patient and her family in detail today including the expected length of recovery and likely postoperative course.  The patient concurred with the proposed plan, giving informed written consent for the surgery today.  Patient instructed on the importance of being NPO after midnight prior to her procedure.  If warranted preoperative prophylactic antibiotics and SCDs ordered on call to the OR to meet SCIP guidelines and adhere to recommendation laid forth in ACOG Practice Bulletin Number 104 May 2009  "Antibiotic Prophylaxis for Gynecologic Procedures".     [name], MD, Evern Core [facility], [facility] 06/27/2020, 8:16 AM

## 2020-06-28 ENCOUNTER — Encounter
Admission: RE | Admit: 2020-06-28 | Discharge: 2020-06-28 | Disposition: A | Discharge status: Home / Self Care | Payer: BC Managed Care – PPO | Source: Ambulatory Visit | Attending: Obstetrics and Gynecology | Admitting: Obstetrics and Gynecology

## 2020-06-28 ENCOUNTER — Other Ambulatory Visit: Payer: Self-pay

## 2020-06-28 HISTORY — DX: Personal history of diseases of the blood and blood-forming organs and certain disorders involving the immune mechanism: Z86.2

## 2020-06-28 HISTORY — DX: Unspecified osteoarthritis, unspecified site: M19.90

## 2020-06-28 NOTE — Patient Instructions (Addendum)
Your procedure is scheduled on:06-30-20 THURSDAY Report to the Registration Desk on the 1st floor of the Medical Mall-Then proceed to the 2nd floor Surgery Desk in the Medical Mall To find out your arrival time, please call [phone] between 1PM - 3PM on:06-29-20 WEDNESDAY  REMEMBER: Instructions that are not followed completely may result in serious medical risk, up to and including death; or upon the discretion of your surgeon and anesthesiologist your surgery may need to be rescheduled.  Do not eat food after midnight the night before surgery.  No gum chewing, lozengers or hard candies.  You may however, drink CLEAR liquids up to 2 hours before you are scheduled to arrive for your surgery. Do not drink anything within 2 hours of your scheduled arrival time.  Clear liquids include: - water  - apple juice without pulp - gatorade - black coffee or tea (Do NOT add milk or creamers to the coffee or tea) Do NOT drink anything that is not on this list.  TAKE THESE MEDICATIONS THE MORNING OF SURGERY WITH A SIP OF WATER: -NONE  One week prior to surgery: Stop Anti-inflammatories (NSAIDS) such as MOBIC (MELOXICAM), Advil, Aleve, Ibuprofen, Motrin, Naproxen, Naprosyn and Aspirin based products such as Excedrin, Goodys Powder, BC Powder-OK TO TAKE TYLENOL IF NEEDED  Stop ANY OVER THE COUNTER supplements until after surgery-STOP YOUR B COMPLEX NOW-YOU MAY RESUME AFTER SURGERY (However, you may continue taking Vitamin D and Vitamin B12 up until the day before surgery.)  No Alcohol for 24 hours before or after surgery.  No Smoking including e-cigarettes for 24 hours prior to surgery.  No chewable tobacco products for at least 6 hours prior to surgery.  No nicotine patches on the day of surgery.  Do not use any "recreational" drugs for at least a week prior to your surgery.  Please be advised that the combination of cocaine and anesthesia may have negative outcomes, up to and including  death. If you test positive for cocaine, your surgery will be cancelled.  On the morning of surgery brush your teeth with toothpaste and water, you may rinse your mouth with mouthwash if you wish. Do not swallow any toothpaste or mouthwash.  Do not wear jewelry, make-up, hairpins, clips or nail polish.  Do not wear lotions, powders, or perfumes.   Do not shave body from the neck down 48 hours prior to surgery just in case you cut yourself which could leave a site for infection.  Also, freshly shaved skin may become irritated if using the CHG soap.  Contact lenses, hearing aids and dentures may not be worn into surgery.  Do not bring valuables to the hospital. [facility] is not responsible for any missing/lost belongings or valuables.   Notify your doctor if there is any change in your medical condition (cold, fever, infection).  Wear comfortable clothing (specific to your surgery type) to the hospital.  Plan for stool softeners for home use; pain medications have a tendency to cause constipation. You can also help prevent constipation by eating foods high in fiber such as fruits and vegetables and drinking plenty of fluids as your diet allows.  After surgery, you can help prevent lung complications by doing breathing exercises.  Take deep breaths and cough every 1-2 hours. Your doctor may order a device called an Incentive Spirometer to help you take deep breaths. When coughing or sneezing, hold a pillow firmly against your incision with both hands. This is called "splinting." Doing this helps protect your  incision. It also decreases belly discomfort.  If you are being admitted to the hospital overnight, leave your suitcase in the car. After surgery it may be brought to your room.  If you are being discharged the day of surgery, you will not be allowed to drive home. You will need a responsible adult (18 years or older) to drive you home and stay with you that night.   If you are  taking public transportation, you will need to have a responsible adult (18 years or older) with you. Please confirm with your physician that it is acceptable to use public transportation.   Please call the [facility] at [phone] if you have any questions about these instructions.  Visitation Policy:  Patients undergoing a surgery or procedure may have one family member or support person with them as long as that person is not COVID-19 positive or experiencing its symptoms.  That person may remain in the waiting area during the procedure.  Inpatient Visitation:    Visiting hours are 7 a.m. to 8 p.m. Patients will be allowed one visitor. The visitor may change daily. The visitor must pass COVID-19 screenings, use hand sanitizer when entering and exiting the patient's room and wear a mask at all times, including in the patient's room. Patients must also wear a mask when staff or their visitor are in the room. Masking is required regardless of vaccination status. Systemwide, no visitors 17 or younger.

## 2020-06-29 ENCOUNTER — Other Ambulatory Visit
Admission: RE | Admit: 2020-06-29 | Discharge: 2020-06-29 | Disposition: A | Discharge status: Home / Self Care | Payer: BC Managed Care – PPO | Source: Ambulatory Visit | Attending: Obstetrics and Gynecology | Admitting: Obstetrics and Gynecology

## 2020-06-29 DIAGNOSIS — Z01812 Encounter for preprocedural laboratory examination: Secondary | ICD-10-CM | POA: Insufficient documentation

## 2020-06-29 DIAGNOSIS — Z20822 Contact with and (suspected) exposure to covid-19: Secondary | ICD-10-CM | POA: Insufficient documentation

## 2020-06-29 DIAGNOSIS — Z791 Long term (current) use of non-steroidal anti-inflammatories (NSAID): Secondary | ICD-10-CM | POA: Diagnosis not present

## 2020-06-29 DIAGNOSIS — N840 Polyp of corpus uteri: Secondary | ICD-10-CM | POA: Diagnosis not present

## 2020-06-29 DIAGNOSIS — Z9081 Acquired absence of spleen: Secondary | ICD-10-CM | POA: Diagnosis not present

## 2020-06-29 DIAGNOSIS — N950 Postmenopausal bleeding: Secondary | ICD-10-CM | POA: Diagnosis present

## 2020-06-29 DIAGNOSIS — Z87891 Personal history of nicotine dependence: Secondary | ICD-10-CM | POA: Diagnosis not present

## 2020-06-29 DIAGNOSIS — Z793 Long term (current) use of hormonal contraceptives: Secondary | ICD-10-CM | POA: Diagnosis not present

## 2020-06-29 LAB — SARS CORONAVIRUS 2 (TAT 6-24 HRS): SARS Coronavirus 2: NEGATIVE

## 2020-06-30 ENCOUNTER — Ambulatory Visit
Admission: RE | Admit: 2020-06-30 | Discharge: 2020-06-30 | Disposition: A | Discharge status: Home / Self Care | Payer: BC Managed Care – PPO | Attending: Obstetrics and Gynecology | Admitting: Obstetrics and Gynecology

## 2020-06-30 ENCOUNTER — Ambulatory Visit: Payer: BC Managed Care – PPO | Admitting: Anesthesiology

## 2020-06-30 ENCOUNTER — Other Ambulatory Visit: Payer: Self-pay

## 2020-06-30 ENCOUNTER — Encounter
Admission: RE | Disposition: A | Discharge status: Home / Self Care | Payer: Self-pay | Source: Home / Self Care | Attending: Obstetrics and Gynecology

## 2020-06-30 ENCOUNTER — Encounter: Payer: Self-pay | Admitting: Obstetrics and Gynecology

## 2020-06-30 ENCOUNTER — Encounter: Payer: Self-pay | Admitting: Family Medicine

## 2020-06-30 DIAGNOSIS — Z793 Long term (current) use of hormonal contraceptives: Secondary | ICD-10-CM | POA: Insufficient documentation

## 2020-06-30 DIAGNOSIS — N72 Inflammatory disease of cervix uteri: Secondary | ICD-10-CM | POA: Diagnosis not present

## 2020-06-30 DIAGNOSIS — N840 Polyp of corpus uteri: Secondary | ICD-10-CM

## 2020-06-30 DIAGNOSIS — Z87891 Personal history of nicotine dependence: Secondary | ICD-10-CM | POA: Insufficient documentation

## 2020-06-30 DIAGNOSIS — Z20822 Contact with and (suspected) exposure to covid-19: Secondary | ICD-10-CM | POA: Insufficient documentation

## 2020-06-30 DIAGNOSIS — N950 Postmenopausal bleeding: Secondary | ICD-10-CM | POA: Diagnosis not present

## 2020-06-30 DIAGNOSIS — Z791 Long term (current) use of non-steroidal anti-inflammatories (NSAID): Secondary | ICD-10-CM | POA: Insufficient documentation

## 2020-06-30 DIAGNOSIS — Z9081 Acquired absence of spleen: Secondary | ICD-10-CM | POA: Insufficient documentation

## 2020-06-30 HISTORY — PX: HYSTEROSCOPY WITH D & C: SHX1775

## 2020-06-30 LAB — URINE DRUG SCREEN, QUALITATIVE (ARMC ONLY)
Amphetamines, Ur Screen: NOT DETECTED
Barbiturates, Ur Screen: NOT DETECTED
Benzodiazepine, Ur Scrn: NOT DETECTED
Cannabinoid 50 Ng, Ur ~~LOC~~: NOT DETECTED
Cocaine Metabolite,Ur ~~LOC~~: NOT DETECTED
MDMA (Ecstasy)Ur Screen: NOT DETECTED
Methadone Scn, Ur: NOT DETECTED
Opiate, Ur Screen: NOT DETECTED
Phencyclidine (PCP) Ur S: NOT DETECTED
Tricyclic, Ur Screen: NOT DETECTED

## 2020-06-30 LAB — POCT PREGNANCY, URINE: Preg Test, Ur: NEGATIVE

## 2020-06-30 SURGERY — DILATATION AND CURETTAGE /HYSTEROSCOPY
Anesthesia: General

## 2020-06-30 MED ORDER — ORAL CARE MOUTH RINSE: 15.0000 mL | Freq: Once | OROMUCOSAL | Status: AC

## 2020-06-30 MED ORDER — HYDROCODONE-ACETAMINOPHEN 7.5-325 MG PO TABS: 1.0000 | ORAL_TABLET | Freq: Once | ORAL | Status: DC | PRN

## 2020-06-30 MED ORDER — POVIDONE-IODINE 10 % EX SWAB: 2.0000 "application " | Freq: Once | CUTANEOUS | Status: DC

## 2020-06-30 MED ORDER — ONDANSETRON HCL 4 MG/2ML IJ SOLN
INTRAMUSCULAR | Status: DC | PRN
  Administered 2020-06-30: 4 mg via INTRAVENOUS

## 2020-06-30 MED ORDER — MIDAZOLAM HCL 2 MG/2ML IJ SOLN
INTRAMUSCULAR | Status: DC | PRN
  Administered 2020-06-30: 2 mg via INTRAVENOUS

## 2020-06-30 MED ORDER — KETOROLAC TROMETHAMINE 30 MG/ML IJ SOLN: 30.0000 mg | Freq: Once | INTRAMUSCULAR | Status: DC | PRN

## 2020-06-30 MED ORDER — PROPOFOL 10 MG/ML IV BOLUS
INTRAVENOUS | Status: AC
  Filled 2020-06-30: qty 20

## 2020-06-30 MED ORDER — MIDAZOLAM HCL 2 MG/2ML IJ SOLN
INTRAMUSCULAR | Status: AC
  Filled 2020-06-30: qty 2

## 2020-06-30 MED ORDER — CHLORHEXIDINE GLUCONATE 0.12 % MT SOLN
OROMUCOSAL | Status: AC
  Administered 2020-06-30: 15 mL via OROMUCOSAL
  Filled 2020-06-30: qty 15

## 2020-06-30 MED ORDER — LACTATED RINGERS IV SOLN: INTRAVENOUS | Status: DC

## 2020-06-30 MED ORDER — ACETAMINOPHEN 160 MG/5ML PO SOLN
325.0000 mg | ORAL | Status: DC | PRN
Start: 2020-06-30 — End: 2020-06-30
  Filled 2020-06-30: qty 20.3

## 2020-06-30 MED ORDER — DROPERIDOL 2.5 MG/ML IJ SOLN
0.6250 mg | Freq: Once | INTRAMUSCULAR | Status: DC | PRN
  Filled 2020-06-30: qty 2

## 2020-06-30 MED ORDER — FENTANYL CITRATE (PF) 100 MCG/2ML IJ SOLN
INTRAMUSCULAR | Status: DC | PRN
  Administered 2020-06-30 (×2): 50 ug via INTRAVENOUS

## 2020-06-30 MED ORDER — FAMOTIDINE 20 MG PO TABS: 20.0000 mg | ORAL_TABLET | Freq: Once | ORAL | Status: AC

## 2020-06-30 MED ORDER — PROPOFOL 10 MG/ML IV BOLUS
INTRAVENOUS | Status: DC | PRN
  Administered 2020-06-30: 150 mg via INTRAVENOUS

## 2020-06-30 MED ORDER — CHLORHEXIDINE GLUCONATE 0.12 % MT SOLN: 15.0000 mL | Freq: Once | OROMUCOSAL | Status: AC

## 2020-06-30 MED ORDER — HYDROCODONE-ACETAMINOPHEN 5-325 MG PO TABS: 1.0000 | ORAL_TABLET | Freq: Four times a day (QID) | ORAL | 0 refills | Status: DC | PRN

## 2020-06-30 MED ORDER — FENTANYL CITRATE (PF) 100 MCG/2ML IJ SOLN
INTRAMUSCULAR | Status: AC
  Filled 2020-06-30: qty 2

## 2020-06-30 MED ORDER — ACETAMINOPHEN 325 MG PO TABS: 325.0000 mg | ORAL_TABLET | ORAL | Status: DC | PRN

## 2020-06-30 MED ORDER — PROMETHAZINE HCL 25 MG/ML IJ SOLN: 6.2500 mg | INTRAMUSCULAR | Status: DC | PRN

## 2020-06-30 MED ORDER — LIDOCAINE HCL (CARDIAC) PF 100 MG/5ML IV SOSY
PREFILLED_SYRINGE | INTRAVENOUS | Status: DC | PRN
  Administered 2020-06-30: 100 mg via INTRAVENOUS

## 2020-06-30 MED ORDER — FAMOTIDINE 20 MG PO TABS
ORAL_TABLET | ORAL | Status: AC
  Administered 2020-06-30: 20 mg via ORAL
  Filled 2020-06-30: qty 1

## 2020-06-30 MED ORDER — DEXAMETHASONE SODIUM PHOSPHATE 10 MG/ML IJ SOLN
INTRAMUSCULAR | Status: DC | PRN
  Administered 2020-06-30: 10 mg via INTRAVENOUS

## 2020-06-30 MED ORDER — FENTANYL CITRATE (PF) 100 MCG/2ML IJ SOLN
25.0000 ug | INTRAMUSCULAR | Status: DC | PRN
Start: 2020-06-30 — End: 2020-06-30

## 2020-06-30 SURGICAL SUPPLY — 23 items
CATH ROBINSON RED A/P 16FR (CATHETERS) ×2 IMPLANT
COVER WAND RF STERILE (DRAPES) IMPLANT
DEVICE MYOSURE LITE (MISCELLANEOUS) ×2 IMPLANT
DEVICE MYOSURE REACH (MISCELLANEOUS) IMPLANT
ELECT REM PT RETURN 9FT ADLT (ELECTROSURGICAL)
ELECTRODE REM PT RTRN 9FT ADLT (ELECTROSURGICAL) IMPLANT
GLOVE SURG ENC MOIS LTX SZ7 (GLOVE) ×2 IMPLANT
GLOVE SURG UNDER LTX SZ7.5 (GLOVE) ×2 IMPLANT
GOWN STRL REUS W/ TWL LRG LVL3 (GOWN DISPOSABLE) ×2 IMPLANT
GOWN STRL REUS W/TWL LRG LVL3 (GOWN DISPOSABLE) ×4
INFUSOR MANOMETER BAG 3000ML (MISCELLANEOUS) IMPLANT
IV LACTATED RINGER IRRG 3000ML (IV SOLUTION)
IV LR IRRIG 3000ML ARTHROMATIC (IV SOLUTION) IMPLANT
IV NS IRRIG 3000ML ARTHROMATIC (IV SOLUTION) ×2 IMPLANT
KIT PROCEDURE FLUENT (KITS) ×2 IMPLANT
KIT TURNOVER CYSTO (KITS) ×2 IMPLANT
MANIFOLD NEPTUNE II (INSTRUMENTS) ×2 IMPLANT
PACK DNC HYST (MISCELLANEOUS) ×2 IMPLANT
PAD OB MATERNITY 4.3X12.25 (PERSONAL CARE ITEMS) ×2 IMPLANT
PAD PREP 24X41 OB/GYN DISP (PERSONAL CARE ITEMS) ×2 IMPLANT
SEAL ROD LENS SCOPE MYOSURE (ABLATOR) ×2 IMPLANT
TOWEL OR 17X26 4PK STRL BLUE (TOWEL DISPOSABLE) ×2 IMPLANT
TUBING CONNECTING 10 (TUBING) ×2 IMPLANT

## 2020-06-30 NOTE — Transfer of Care (Signed)
Immediate Anesthesia Transfer of Care Note  Patient: Teresa Abbott  Procedure(s) Performed: DILATATION AND CURETTAGE /HYSTEROSCOPY (N/A )  Patient Location: PACU  Anesthesia Type:General  Level of Consciousness: drowsy  Airway & Oxygen Therapy: Patient connected to face mask oxygen  Post-op Assessment: Post -op Vital signs reviewed and stable  Post vital signs: stable  Last Vitals:  Vitals Value Taken Time  BP 114/74 06/30/20 1549  Temp    Pulse 77 06/30/20 1554  Resp 14 06/30/20 1554  SpO2 97 % 06/30/20 1554  Vitals shown include unvalidated device data.  Last Pain:  Vitals:   06/30/20 1210  TempSrc: Tympanic  PainSc: 0-No pain         Complications: No complications documented.

## 2020-06-30 NOTE — Op Note (Signed)
Preoperative Diagnosis: 1) 55 y.o. with postmenopausal bleeding 2) Endometrial polyp  Postoperative Diagnosis: 1) 55 y.o. with postmenopausal bleeding 2) Endometrial polyp  Operation Performed: Hysteroscopy, dilation and curettage, polypectomy  Indication: Ultrasound evaluation for bleeding revealed endometrial polyp  Anesthesia: General (LMA)  Primary Surgeon: [name], MD  Assistant: none  Preoperative Antibiotics: none  Estimated Blood Loss: minimal  IV Fluids:  Urine Output:: ~52mL straight cath  Drains or Tubes: none  Implants: none  Specimens Removed: endometrial curettings and polyp, ECC  Complications: none  Intraoperative Findings:  There was a polyp partial prolapsing through the cervical canal, hysteroscopy revealed normal tubal ostia bilaterally, normal fundus, with a polyp arising from the posterior lower uterine segement  Patient Condition: stable  Procedure in Detail:  Patient was taken to the operating room were she was administered general endotracheal anesthesia.  She was positioned in the dorsal lithotomy position utilizing Allen stirups, prepped and draped in the usual sterile fashion.  Uterus was noted to be non-enlarged in size, anteverted.   Prior to proceeding with the case a time out was performed.  Attention was turned to the patient's pelvis.  A red rubber catheter was used to empty the patient's bladder.  An operative speculum was placed to allow visualization of the cervix.  The anterior lip of the cervix was grasped with a single tooth tenaculum, dilation was not required secondary to partial prolapse of polyp through the cervix.  The cervical polyp was removed using polyp forceps as well as a box curette.  The hysteroscope was then advanced into the uterine cavity noting the above findings.  The polyp was resected using the Myosure device.  Targeted 4 quadrant curettage was then undertaken with the Myosure and the resulting specimens  collected and sent to pathology.    The single tooth tenaculum was removed from the cervix.  The tenaculum sites and cervix were noted to be  Hemostatic before removing the operative speculum.  Sponge needle and instrument counts were corrects times two.  The patient tolerated the procedure well and was taken to the recovery room in stable condition.

## 2020-06-30 NOTE — Anesthesia Postprocedure Evaluation (Signed)
Anesthesia Post Note  Patient: Teresa Abbott  Procedure(s) Performed: DILATATION AND CURETTAGE /HYSTEROSCOPY (N/A )  Patient location during evaluation: PACU Anesthesia Type: General Level of consciousness: awake and alert Pain management: pain level controlled Vital Signs Assessment: post-procedure vital signs reviewed and stable Respiratory status: spontaneous breathing, nonlabored ventilation, respiratory function stable and patient connected to nasal cannula oxygen Cardiovascular status: blood pressure returned to baseline and stable Postop Assessment: no apparent nausea or vomiting Anesthetic complications: no   No complications documented.   Last Vitals:  Vitals:   06/30/20 1634 06/30/20 1635  BP: 117/60 117/60  Pulse: 74   Resp: 14   Temp:    SpO2: 96%     Last Pain:  Vitals:   06/30/20 1634  TempSrc:   PainSc: 0-No pain                 Corinda Gubler

## 2020-06-30 NOTE — Anesthesia Preprocedure Evaluation (Addendum)
Anesthesia Evaluation  Patient identified by MRN, date of birth, ID band Patient awake    Reviewed: Allergy & Precautions, H&P , NPO status , Patient's Chart, lab work & pertinent test results, reviewed documented beta blocker date and time   Airway Mallampati: II  TM Distance: >3 FB Neck ROM: full    Dental  (+) Missing, Caps, Teeth Intact   Pulmonary neg pulmonary ROS, former smoker,    Pulmonary exam normal        Cardiovascular negative cardio ROS Normal cardiovascular exam     Neuro/Psych negative neurological ROS     GI/Hepatic negative GI ROS, Neg liver ROS, neg GERD  ,  Endo/Other  negative endocrine ROS  Renal/GU negative Renal ROS     Musculoskeletal  (+) Arthritis ,   Abdominal   Peds  Hematology negative hematology ROS (+) Plts 342K 05/2020    Anesthesia Other Findings Past Medical History: No date: Abnormal vaginal Pap smear No date: Arthritis     Comment:  RIGHT WRIST 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:  DUE TO ITP No date: TONSILLECTOMY   Reproductive/Obstetrics                            Anesthesia Physical Anesthesia Plan  ASA: II  Anesthesia Plan: General   Post-op Pain Management:    Induction: Intravenous  PONV Risk Score and Plan: 3 and Ondansetron, Dexamethasone, Midazolam and Treatment may vary due to age or medical condition  Airway Management Planned: LMA  Additional Equipment:   Intra-op Plan:   Post-operative Plan: Extubation in OR  Informed Consent: I have reviewed the patients History and Physical, chart, labs and discussed the procedure including the risks, benefits and alternatives for the proposed anesthesia with the patient or authorized representative who has indicated his/her understanding and acceptance.     Dental Advisory Given  Plan Discussed with: CRNA  Anesthesia Plan Comments:          Anesthesia Quick Evaluation

## 2020-06-30 NOTE — Anesthesia Procedure Notes (Signed)
Procedure Name: LMA Insertion Date/Time: 06/30/2020 3:05 PM Performed by: Irving Burton, CRNA Pre-anesthesia Checklist: Patient identified, Emergency Drugs available, Suction available and Patient being monitored Patient Re-evaluated:Patient Re-evaluated prior to induction Oxygen Delivery Method: Circle system utilized Preoxygenation: Pre-oxygenation with 100% oxygen Induction Type: IV induction Ventilation: Mask ventilation without difficulty LMA: LMA inserted LMA Size: 3.5 Number of attempts: 1 Placement Confirmation: positive ETCO2 and breath sounds checked- equal and bilateral Tube secured with: Tape Dental Injury: Teeth and Oropharynx as per pre-operative assessment

## 2020-06-30 NOTE — Interval H&P Note (Signed)
History and Physical Interval Note:  06/30/2020 12:16 PM  Teresa Abbott  has presented today for surgery, with the diagnosis of Abnormal uterine bleeding.  The various methods of treatment have been discussed with the patient and family. After consideration of risks, benefits and other options for treatment, the patient has consented to  Procedure(s): DILATATION AND CURETTAGE /HYSTEROSCOPY (N/A) as a surgical intervention.  The patient's history has been reviewed, patient examined, no change in status, stable for surgery.  I have reviewed the patient's chart and labs.  Questions were answered to the patient's satisfaction.     [name]

## 2020-07-01 ENCOUNTER — Encounter: Payer: Self-pay | Admitting: Obstetrics and Gynecology

## 2020-07-05 LAB — SURGICAL PATHOLOGY

## 2020-07-06 ENCOUNTER — Encounter: Payer: Self-pay | Admitting: Obstetrics & Gynecology

## 2020-07-06 ENCOUNTER — Other Ambulatory Visit: Payer: Self-pay

## 2020-07-06 ENCOUNTER — Ambulatory Visit (INDEPENDENT_AMBULATORY_CARE_PROVIDER_SITE_OTHER): Payer: BC Managed Care – PPO | Admitting: Obstetrics & Gynecology

## 2020-07-06 VITALS — BP 120/80 | Ht 70.0 in | Wt 208.0 lb

## 2020-07-06 DIAGNOSIS — N840 Polyp of corpus uteri: Secondary | ICD-10-CM

## 2020-07-06 MED ORDER — LEVONORGEST-ETH ESTRAD 91-DAY 0.15-0.03 &0.01 MG PO TABS: 1.0000 | ORAL_TABLET | Freq: Every day | ORAL | 4 refills | Status: DC

## 2020-07-06 NOTE — Progress Notes (Signed)
  Postoperative Follow-up Patient presents post op from operative hysteroscopy for abnormal uterine bleeding and polyp, 1 week ago by Dr Bonney Aid  Path: DIAGNOSIS:  A. ENDOCERVIX, CURETTAGE:  - ENDOCERVICAL MUCOSA WITH ACUTE AND CHRONIC INFLAMMATION AND REACTIVE  CHANGES.  - BENIGN SQUAMOUS EPITHELIUM.  B. ENDOMETRIAL POLYP; CURETTAGE:  - BENIGN ENDOMETRIAL POLYP.  - NEGATIVE FOR ATYPIA / EIN AND MALIGNANCY.   Subjective: Patient reports marked improvement in her preop symptoms. Eating a regular diet without difficulty. The patient is not having any pain.  Activity: normal activities of daily living. Patient reports additional symptom's since surgery of No bleeding since the procedure.  Objective: BP 120/80   Ht 5\' 10"  (1.778 m)   Wt 208 lb (94.3 kg)   BMI 29.84 kg/m  Physical Exam Constitutional:      General: She is not in acute distress.    Appearance: She is well-developed and well-nourished.  Musculoskeletal:        General: Normal range of motion.  Neurological:     Mental Status: She is alert and oriented to person, place, and time.  Skin:    General: Skin is warm and dry.  Psychiatric:        Mood and Affect: Mood and affect normal.  Vitals reviewed.     Assessment: s/p :  operative hysteroscopy and Polypectomy progressing well  Plan: Patient has done well after surgery with no apparent complications.  I have discussed the post-operative course to date, and the expected progress moving forward.  The patient understands what complications to be concerned about.  I will see the patient in routine follow up, or sooner if needed.    Activity plan: No restriction.  Lengthy discussion regarding need for birth control.  She is adamant about not getting pregnant, and is unclear on menstrual history as she has been having bleeding (related to polyp).  Denies menopausal sx's.  I have advised her to continue OCPs as she has been on.  She has no risk factors for hormone BC.    If she has no further bleeding of any variety after having this procedure, then she probably is perimenopasual and safe to be off of contraception.  07/06/2020, 4:46 PM

## 2020-07-07 NOTE — Progress Notes (Signed)
Postop in 5 weeks

## 2020-08-12 ENCOUNTER — Ambulatory Visit: Payer: BC Managed Care – PPO | Admitting: Obstetrics and Gynecology

## 2020-08-15 ENCOUNTER — Encounter: Payer: Self-pay | Admitting: Obstetrics and Gynecology

## 2020-08-15 ENCOUNTER — Other Ambulatory Visit: Payer: Self-pay

## 2020-08-15 ENCOUNTER — Ambulatory Visit: Payer: BC Managed Care – PPO | Admitting: Obstetrics and Gynecology

## 2020-08-15 VITALS — BP 110/60 | Wt 214.0 lb

## 2020-08-15 DIAGNOSIS — Z4889 Encounter for other specified surgical aftercare: Secondary | ICD-10-CM

## 2020-08-15 NOTE — Progress Notes (Signed)
Postoperative Follow-up Patient presents post op from hysteroscopy, D&C, polypectomy 6weeks ago for abnormal uterine bleeding and endometrial polyp.  Subjective: Patient reports marked improvement in her preop symptoms. Eating a regular diet without difficulty. The patient is not having any pain.  Activity: normal activities of daily living.  Objective: Blood pressure 110/60, weight 214 lb (97.1 kg).  General: NAD Pulmonary: no increased work of breathing GU: normal external female genitalia normal cervix, no CMT, uterus normal in shape and contour, no adnexal tenderness or masses Extremities: no edema Neurologic: normal gait    Admission on 06/30/2020, Discharged on 06/30/2020  Component Date Value Ref Range Status  . Tricyclic, Ur Screen 06/30/2020 NONE DETECTED  NONE DETECTED Final  . Amphetamines, Ur Screen 06/30/2020 NONE DETECTED  NONE DETECTED Final  . MDMA (Ecstasy)Ur Screen 06/30/2020 NONE DETECTED  NONE DETECTED Final  . Cocaine Metabolite,Ur [place] 06/30/2020 NONE DETECTED  NONE DETECTED Final  . Opiate, Ur Screen 06/30/2020 NONE DETECTED  NONE DETECTED Final  . Phencyclidine (PCP) Ur S 06/30/2020 NONE DETECTED  NONE DETECTED Final  . Cannabinoid 50 Ng, Ur [place] 06/30/2020 NONE DETECTED  NONE DETECTED Final  . Barbiturates, Ur Screen 06/30/2020 NONE DETECTED  NONE DETECTED Final  . Benzodiazepine, Ur Scrn 06/30/2020 NONE DETECTED  NONE DETECTED Final  . Methadone Scn, Ur 06/30/2020 NONE DETECTED  NONE DETECTED Final   Comment: (NOTE) Tricyclics + metabolites, urine    Cutoff 1000 ng/mL Amphetamines + metabolites, urine  Cutoff 1000 ng/mL MDMA (Ecstasy), urine              Cutoff 500 ng/mL Cocaine Metabolite, urine          Cutoff 300 ng/mL Opiate + metabolites, urine        Cutoff 300 ng/mL Phencyclidine (PCP), urine         Cutoff 25 ng/mL Cannabinoid, urine                 Cutoff 50 ng/mL Barbiturates + metabolites, urine  Cutoff 200 ng/mL Benzodiazepine, urine               Cutoff 200 ng/mL Methadone, urine                   Cutoff 300 ng/mL  The urine drug screen provides only a preliminary, unconfirmed analytical test result and should not be used for non-medical purposes. Clinical consideration and professional judgment should be applied to any positive drug screen result due to possible interfering substances. A more specific alternate chemical method must be used in order to obtain a confirmed analytical result. Gas chromatography / mass spectrometry (GC/MS) is the preferred confirm                          atory method. Performed at [facility], [address], [place], [place] [address]   . Preg Test, Ur 06/30/2020 NEGATIVE  NEGATIVE Final   Comment:        THE SENSITIVITY OF THIS METHODOLOGY IS >24 mIU/mL   . SURGICAL PATHOLOGY 06/30/2020    Final-Edited                   Value:SURGICAL PATHOLOGY CASE: [id number] PATIENT: [facility] Surgical Pathology Report     Specimen Submitted: A. Endocervical curettings B. Endometrial curettings/polyp  Clinical History: Abnormal uterine bleeding      DIAGNOSIS: A. ENDOCERVIX, CURETTAGE: - ENDOCERVICAL MUCOSA WITH ACUTE AND CHRONIC INFLAMMATION  AND REACTIVE CHANGES. - BENIGN SQUAMOUS EPITHELIUM.  B. ENDOMETRIAL POLYP; CURETTAGE: - BENIGN ENDOMETRIAL POLYP. - NEGATIVE FOR ATYPIA / EIN AND MALIGNANCY.  Comment: Immunohistochemical stain for p16 (block A1) is negative.  IHC slides were prepared by Sammuel Hines, [place]. All controls stained appropriately.  This test was developed and its performance characteristics determined by [facility]. It has not been cleared or approved by the [place] Food and Drug Administration. The FDA does not require this test to go through premarket FDA review. This test is used for clinical purposes. It should not be regarded as investigational or for researc                         h. This laboratory is certified under the Clinical  Laboratory Improvement Amendments (CLIA) as qualified to perform high complexity clinical laboratory testing.   GROSS DESCRIPTION: A. Labeled: Endocervical curettings Received: In formalin Collection time: 3:17 PM on 06/30/2020 Placed into formalin time: 3:23 PM on 06/30/2020 Tissue fragment(s): Multiple Size: 2.5 x 2.5 x 0.6 cm Description: Aggregate of pale-tan polypoid tissue and mucus Entirely submitted in 2 cassettes.  B. Labeled: Endometrial curettings/polyp Received: In formalin Collection time: 3:31 PM on 06/30/2020 Placed into formalin time: 3:34 PM on 06/30/2020 Tissue fragment(s): Multiple Size: 2.5 x 1.2 x 0.3 cm Description: Aggregate of pale-tan soft tissue admixed with blood clot Entirely submitted in 1 cassette.  Final Diagnosis performed by Georgeanna Harrison, MD.   Electronically signed 07/05/2020 1:29:19PM The electronic signature indicates that the named Attending Pathologist has evaluated the spec                         imen Technical component performed at [place], [address], [place], [place] [address] Lab: [phone] Dir: Jolene Schimke, MD, MMM  Professional component performed at [facility], [address] [place], [place], [place] [address] Lab: [phone] Dir: Georgiann Cocker. Oneita Kras, MD     Assessment: 55 y.o. s/p hysteroscopy, D&C, polypectomy stable  Plan: Patient has done well after surgery with no apparent complications.  I have discussed the post-operative course to date, and the expected progress moving forward.  The patient understands what complications to be concerned about.  I will see the patient in routine follow up, or sooner if needed.    Activity plan: No restriction.  Mixed incontinence - discussed timed voids, and kegels.  Offered pelvic floor PT.  Also offered urogyn referral   [name], MD, [facility], [facility] 08/15/2020, 4:36 PM

## 2021-01-24 DIAGNOSIS — M5416 Radiculopathy, lumbar region: Secondary | ICD-10-CM | POA: Insufficient documentation

## 2021-03-30 ENCOUNTER — Telehealth: Payer: Self-pay

## 2021-03-30 ENCOUNTER — Other Ambulatory Visit: Payer: Self-pay | Admitting: Obstetrics and Gynecology

## 2021-03-30 ENCOUNTER — Ambulatory Visit: Payer: BC Managed Care – PPO | Admitting: Obstetrics and Gynecology

## 2021-03-30 DIAGNOSIS — N840 Polyp of corpus uteri: Secondary | ICD-10-CM

## 2021-03-30 DIAGNOSIS — N939 Abnormal uterine and vaginal bleeding, unspecified: Secondary | ICD-10-CM

## 2021-03-30 NOTE — Telephone Encounter (Signed)
Pt calling; has hysteroscopy D&C c AMS 06/29/20; is menopausal; has started bleeding again today.  What to do?  [phone]

## 2021-03-30 NOTE — Telephone Encounter (Signed)
Patient is scheduled 03/30/21 AMS

## 2021-04-05 ENCOUNTER — Other Ambulatory Visit: Payer: Self-pay

## 2021-04-05 ENCOUNTER — Ambulatory Visit
Admission: RE | Admit: 2021-04-05 | Discharge: 2021-04-05 | Disposition: A | Discharge status: Home / Self Care | Payer: BC Managed Care – PPO | Source: Ambulatory Visit | Attending: Obstetrics and Gynecology | Admitting: Obstetrics and Gynecology

## 2021-04-05 DIAGNOSIS — N939 Abnormal uterine and vaginal bleeding, unspecified: Secondary | ICD-10-CM

## 2021-04-05 DIAGNOSIS — N840 Polyp of corpus uteri: Secondary | ICD-10-CM | POA: Insufficient documentation

## 2021-04-17 ENCOUNTER — Other Ambulatory Visit: Payer: Self-pay

## 2021-04-17 ENCOUNTER — Ambulatory Visit (INDEPENDENT_AMBULATORY_CARE_PROVIDER_SITE_OTHER): Payer: BC Managed Care – PPO | Admitting: Obstetrics and Gynecology

## 2021-04-17 VITALS — BP 100/62 | Ht 70.5 in | Wt 218.0 lb

## 2021-04-17 DIAGNOSIS — N950 Postmenopausal bleeding: Secondary | ICD-10-CM | POA: Diagnosis not present

## 2021-04-17 NOTE — Progress Notes (Signed)
Gynecology Ultrasound Follow Up  Chief Complaint:  Chief Complaint  Patient presents with   Follow-up    TVUS - RM 5     History of Present Illness: Patient is a 55 y.o. female who presents today for ultrasound evaluation of AUB .  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 (Menstrual status: Oral contraceptives). Contraception: post menopausal status Last Pap: 05/19/20 Results were: .no abnormalities  Family History:  Family History  Problem Relation Age of Onset   Breast cancer Mother 28   Heart disease Brother     Social History:  Social History   Socioeconomic History   Marital status: Married    Spouse name: Not on file   Number of children: Not on file   Years of education: Not on file   Highest education level: Not on file  Occupational History   Not on file  Tobacco Use   Smoking status: Former    Packs/day: 0.50    Years: 8.00    Pack years: 4.00    Types: Cigarettes    Quit date: 04/18/1990    Years since quitting: 31.0   Smokeless tobacco: Never  Vaping Use   Vaping Use: Never used  Substance and Sexual Activity   Alcohol use: Yes    Alcohol/week: 2.0 standard drinks    Types: 2 Standard drinks or equivalent per week    Comment: OCC   Drug use: Not Currently    Types: Marijuana   Sexual activity: Yes    Birth  control/protection: Pill  Other Topics Concern   Not on file  Social History Narrative   Not on file   Social Determinants of Health   Financial Resource Strain: Not on file  Food Insecurity: Not on file  Transportation Needs: Not on file  Physical Activity: Not on file  Stress: Not on file  Social Connections: Not on file  Intimate Partner Violence: Not on file    Allergies:  No Known Allergies  Medications: Prior to Admission medications   Medication Sig Start Date End Date Taking? Authorizing Provider  meloxicam (MOBIC) 15 MG tablet Take 15 mg by mouth daily.    [provider]    Physical Exam Vitals: Blood pressure 100/62, height 5' 10.5" (1.791 m), weight 218 lb (98.9 kg).  General: NAD HEENT: normocephalic, anicteric Pulmonary: No increased work of breathing Extremities: no edema, erythema, or tenderness Neurologic: Grossly intact, normal gait Psychiatric: mood appropriate, affect full   Assessment: 55 y.o. postmenopausal bleeding   Plan: Problem List Items Addressed This Visit       Other   PMB (postmenopausal bleeding) - Primary    1) Postmenopausal bleeding - s/p hysteroscopic resection benign polyp 06/30/20, images reviewed no evidence of endometrial polyp seen.  The EMS is well below 51mm.  No  further bleeding noted by patient, expectanat management.  2) A total of 15 minutes were spent in face-to-face contact with the patient during this encounter with over half of that time devoted to counseling and coordination of care.  3) Return in about 1 year (around 04/17/2022) for annual.    [name], MD, [facility], [facility] 04/17/2021, 4:26 PM

## 2021-05-25 ENCOUNTER — Encounter: Payer: BC Managed Care – PPO | Admitting: Internal Medicine

## 2021-06-21 ENCOUNTER — Ambulatory Visit: Payer: BC Managed Care – PPO | Admitting: Podiatry

## 2021-06-26 ENCOUNTER — Encounter: Payer: Self-pay | Admitting: Podiatry

## 2021-06-26 ENCOUNTER — Ambulatory Visit: Payer: BC Managed Care – PPO | Admitting: Podiatry

## 2021-06-26 ENCOUNTER — Other Ambulatory Visit: Payer: Self-pay

## 2021-06-26 ENCOUNTER — Other Ambulatory Visit: Payer: Self-pay | Admitting: Podiatry

## 2021-06-26 ENCOUNTER — Ambulatory Visit: Payer: BC Managed Care – PPO

## 2021-06-26 ENCOUNTER — Ambulatory Visit (INDEPENDENT_AMBULATORY_CARE_PROVIDER_SITE_OTHER): Payer: BC Managed Care – PPO

## 2021-06-26 DIAGNOSIS — F32A Depression, unspecified: Secondary | ICD-10-CM | POA: Insufficient documentation

## 2021-06-26 DIAGNOSIS — M722 Plantar fascial fibromatosis: Secondary | ICD-10-CM

## 2021-06-26 MED ORDER — METHYLPREDNISOLONE 4 MG PO TBPK: ORAL_TABLET | ORAL | 0 refills | Status: DC

## 2021-06-26 MED ORDER — TRIAMCINOLONE ACETONIDE 40 MG/ML IJ SUSP
40.0000 mg | Freq: Once | INTRAMUSCULAR | Status: AC
  Administered 2021-06-26: 40 mg

## 2021-06-26 MED ORDER — MELOXICAM 15 MG PO TABS: 15.0000 mg | ORAL_TABLET | Freq: Every day | ORAL | 3 refills | Status: DC

## 2021-06-26 NOTE — Progress Notes (Signed)
°  Subjective:  Patient ID: Teresa Abbott, female    DOB: 08/05/1965,  MRN: [id number] HPI Chief Complaint  Patient presents with   Foot Pain    Bilateral feet (R>L) - tired, aching feet x 1 year, 6 months ago, lateral right foot started to have sharp pains, aching and swelling, went to [facility] for back and hip pain-checked feet-advised new shoes and inserts, now heel have become severe, AM pain   New Patient (Initial Visit)    56 y.o. female presents with the above complaint.   ROS: Denies fever chills nausea mobic muscle aches pains calf pain back pain chest pain shortness of breath.  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  General: Well developed, nourished, in no acute distress, alert and oriented x3   Dermatological: Skin is warm, dry and supple bilateral. Nails x 10 are well maintained; remaining integument appears unremarkable at this time. There are no open sores, no preulcerative lesions, no rash or signs of infection present.  Vascular: Dorsalis Pedis artery and Posterior Tibial artery pedal pulses are 2/4 bilateral with immedate capillary fill time. Pedal hair growth present. No varicosities and no lower extremity edema present bilateral.   Neruologic:  Grossly intact via light touch bilateral. Vibratory intact via tuning fork bilateral. Protective threshold with Semmes Wienstein monofilament intact to all pedal sites bilateral. Patellar and Achilles deep tendon reflexes 2+ bilateral. No Babinski or clonus noted bilateral.   Musculoskeletal: No gross boney pedal deformities bilateral. No pain, crepitus, or limitation noted with foot and ankle range of motion bilateral. Muscular strength 5/5 in all groups tested bilateral.  Severe pain on palpation medial continued tubercle of the right heel.  Plantar fascial calcaneal insertion site is most painful.  Left heel similarly  Gait: Unassisted, Nonantalgic.    Radiographs:  Radiographs taken today demonstrate osseously mature individual heels bilaterally demonstrate soft tissue increase in density plantar fascial Caney insertion site right worse than left.  Assessment & Plan:   Assessment: Plantar fasciitis right greater than left  Plan: Discussed etiology pathology conservative versus surgical therapies I injected bilateral heels today 20 mg Kenalog 5 mg Marcaine point of maximal tenderness.  Started her on plantar fascia braces as well and a single night splint.  She also was started on methylprednisolone to be followed by meloxicam.  We discussed appropriate shoe gear stretching exercises ice therapy and trigger modifications like to follow-up with her in 1 month may need to consider orthotics     Teresa Abbott T. [place], [place]

## 2021-07-24 ENCOUNTER — Encounter: Payer: BC Managed Care – PPO | Admitting: Podiatry

## 2021-08-22 ENCOUNTER — Ambulatory Visit: Payer: BC Managed Care – PPO | Admitting: Family Medicine

## 2021-08-22 ENCOUNTER — Encounter: Payer: Self-pay | Admitting: Family Medicine

## 2021-08-22 VITALS — BP 118/64 | HR 72 | Ht 70.5 in | Wt 208.0 lb

## 2021-08-22 DIAGNOSIS — R102 Pelvic and perineal pain: Secondary | ICD-10-CM | POA: Diagnosis not present

## 2021-08-22 NOTE — Progress Notes (Signed)
? ?  GYNECOLOGY PROBLEM  VISIT ENCOUNTER NOTE ? ?Subjective:  ? Teresa Abbott is a 56 y.o. [phone] female here for a problem GYN visit.  Current complaints: None ? ?Treonna was seen at ER for lower abdominal pain after a fall. CT at ER showed accessory veins  on right.    ? 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 There is mild dilatation of the right ovarian vein. Nonspecific but can be seen in pelvic congestion syndrome. Recommend clinical correlation.  ? ?- Otherwise, no acute intra-abdominal abnormality to explain patient's abdominal pain. Normal appendix. ? ?Denies abnormal vaginal bleeding, discharge, pelvic pain, problems with intercourse or other gynecologic concerns.  ?  ?Gynecologic History ?No LMP recorded. (Menstrual status: Oral contraceptives). ? ?Contraception: post menopausal status ? ?Health Maintenance Due  ?Topic Date Due  ? HIV Screening  Never done  ? Hepatitis C Screening  Never done  ? TETANUS/TDAP  Never done  ? Zoster Vaccines- Shingrix (1 of 2) Never done  ? COVID-19 Vaccine (4 - Booster for Pfizer series) 06/03/2020  ? ? ?The following portions of the patient's history were reviewed and updated as appropriate: allergies, current medications, past family history, past medical history, past social history, past surgical history and problem list. ? ?Review of Systems ?Pertinent items are noted in HPI. ?  ?Objective:  ?BP 118/64   Pulse 72   Ht 5' 10.5" (1.791 m)   Wt 208 lb (94.3 kg)   BMI 29.42 kg/m?  ?Gen: well appearing, NAD ?HEENT: no scleral icterus ?CV: RR ?Lung: Normal WOB ?Ext: warm well perfused ? ? ?Assessment and Plan:  ? ?1. Pelvic pain ?Resolved at this point ?Shared decision making about utility of vascular consult for pelvic congestion. Patient feels this is not needed at this time since pain is improved. She would like to wait on referral ? ?Please refer to After Visit Summary for other counseling recommendations.  ? ?Return if symptoms  worsen or fail to improve. ? ?Federico Flake, MD, MPH, ABFM ?Attending Physician ?[facility] ? ?

## 2021-08-25 ENCOUNTER — Encounter: Payer: Self-pay | Admitting: Family Medicine

## 2021-11-08 ENCOUNTER — Ambulatory Visit: Payer: BC Managed Care – PPO | Admitting: Podiatry

## 2021-11-08 ENCOUNTER — Encounter: Payer: Self-pay | Admitting: Podiatry

## 2021-11-08 DIAGNOSIS — M722 Plantar fascial fibromatosis: Secondary | ICD-10-CM

## 2021-11-08 MED ORDER — TRIAMCINOLONE ACETONIDE 40 MG/ML IJ SUSP
40.0000 mg | Freq: Once | INTRAMUSCULAR | Status: AC
  Administered 2021-11-08: 40 mg

## 2021-11-08 MED ORDER — MELOXICAM 15 MG PO TABS: 15.0000 mg | ORAL_TABLET | Freq: Every day | ORAL | 3 refills | Status: DC

## 2021-11-08 MED ORDER — METHYLPREDNISOLONE 4 MG PO TBPK: ORAL_TABLET | ORAL | 0 refills | Status: DC

## 2021-11-08 NOTE — Progress Notes (Signed)
She presents today chief complaint of painful heels once again.  States that since she got done with school she has been in the garden a lot more wearing crocs and sandals rather than her [employer]idas or A6 type tennis shoes.  Objective: Vital signs are stable she is alert and oriented x3.  Pulses are palpable.  The pain on palpation medial calcaneal tubercles bilateral.  Assessment: Pain in limb secondary to Planter fasciitis bilateral.  Plan: Injected bilateral heels today 20 mg Kenalog 5 mg Marcaine point maximal tenderness bilateral heel and start her on methylprednisolone be followed by meloxicam.  Follow-up with her in 6 weeks may need to consider orthotics

## 2021-11-13 ENCOUNTER — Encounter: Payer: Self-pay | Admitting: Internal Medicine

## 2021-11-13 ENCOUNTER — Ambulatory Visit (INDEPENDENT_AMBULATORY_CARE_PROVIDER_SITE_OTHER): Payer: BC Managed Care – PPO | Admitting: Internal Medicine

## 2021-11-13 VITALS — BP 112/72 | HR 66 | Temp 96.9°F | Ht 70.0 in | Wt 205.0 lb

## 2021-11-13 DIAGNOSIS — Z23 Encounter for immunization: Secondary | ICD-10-CM

## 2021-11-13 DIAGNOSIS — Z6829 Body mass index (BMI) 29.0-29.9, adult: Secondary | ICD-10-CM

## 2021-11-13 DIAGNOSIS — E66811 Obesity, class 1: Secondary | ICD-10-CM | POA: Insufficient documentation

## 2021-11-13 DIAGNOSIS — Z0001 Encounter for general adult medical examination with abnormal findings: Secondary | ICD-10-CM | POA: Diagnosis not present

## 2021-11-13 DIAGNOSIS — E663 Overweight: Secondary | ICD-10-CM | POA: Insufficient documentation

## 2021-11-13 DIAGNOSIS — E7800 Pure hypercholesterolemia, unspecified: Secondary | ICD-10-CM | POA: Insufficient documentation

## 2021-11-13 DIAGNOSIS — Z1231 Encounter for screening mammogram for malignant neoplasm of breast: Secondary | ICD-10-CM | POA: Diagnosis not present

## 2021-11-13 DIAGNOSIS — Z1159 Encounter for screening for other viral diseases: Secondary | ICD-10-CM

## 2021-11-13 DIAGNOSIS — D75839 Thrombocytosis, unspecified: Secondary | ICD-10-CM | POA: Insufficient documentation

## 2021-11-13 MED ORDER — CELECOXIB 50 MG PO CAPS: 50.0000 mg | ORAL_CAPSULE | Freq: Two times a day (BID) | ORAL | 1 refills | Status: DC

## 2021-11-13 NOTE — Progress Notes (Signed)
Subjective:    Patient ID: Teresa Abbott, female    DOB: Nov 01, 1965, 56 y.o.   MRN: [id number]  HPI  Patient presents to clinic today for her annual exam.  Flu: 03/2020 Tetanus: > 10 years ago COVID: Pfizer x3 Shingrix: Never Pap smear: 05/2020 Mammogram: 03/2020 Colon screening: Cologuard 06/2020 Vision screening: annually Dentist: biannually  Diet: She does eat meat. She consumes fruits and veggies. She does eat some fried foods. She drinks mostly water. Exercise: Stationary bike 4 x week  Review of Systems   Past Medical History:  Diagnosis Date   Abnormal vaginal Pap smear    Arthritis    RIGHT WRIST   History of ITP     Current Outpatient Medications  Medication Sig Dispense Refill   meloxicam (MOBIC) 15 MG tablet Take 1 tablet (15 mg total) by mouth daily. 30 tablet 3   methylPREDNISolone (MEDROL DOSEPAK) 4 MG TBPK tablet 6 day dose pack - take as directed 21 tablet 0   No current facility-administered medications for this visit.    No Known Allergies  Family History  Problem Relation Age of Onset   Breast cancer Mother 52   Heart disease Brother     Social History   Socioeconomic History   Marital status: Married    Spouse name: Not on file   Number of children: Not on file   Years of education: Not on file   Highest education level: Not on file  Occupational History   Not on file  Tobacco Use   Smoking status: Former    Packs/day: 0.50    Years: 8.00    Total pack years: 4.00    Types: Cigarettes    Quit date: 04/18/1990    Years since quitting: 31.5   Smokeless tobacco: Never  Vaping Use   Vaping Use: Never used  Substance and Sexual Activity   Alcohol use: Yes    Alcohol/week: 2.0 standard drinks of alcohol    Types: 2 Standard drinks or equivalent per week    Comment: OCC   Drug use: Not Currently    Types: Marijuana   Sexual activity: Yes    Birth control/protection: Pill  Other Topics Concern   Not on file  Social History  Narrative   Not on file   Social Determinants of Health   Financial Resource Strain: Not on file  Food Insecurity: Not on file  Transportation Needs: Not on file  Physical Activity: Not on file  Stress: Not on file  Social Connections: Not on file  Intimate Partner Violence: Not on file     Constitutional: Denies fever, malaise, fatigue, headache or abrupt weight changes.  HEENT: Denies eye pain, eye redness, ear pain, ringing in the ears, wax buildup, runny nose, nasal congestion, bloody nose, or sore throat. Respiratory: Denies difficulty breathing, shortness of breath, cough or sputum production.   Cardiovascular: Denies chest pain, chest tightness, palpitations or swelling in the hands or feet.  Gastrointestinal: Denies abdominal pain, bloating, constipation, diarrhea or blood in the stool.  GU: Denies urgency, frequency, pain with urination, burning sensation, blood in urine, odor or discharge. Musculoskeletal: Patient reports joint pain.  Denies decrease in range of motion, difficulty with gait, muscle pain or joint swelling.  Skin: Denies redness, rashes, lesions or ulcercations.  Neurological: Pt reports numbness in toes. Denies dizziness, difficulty with memory, difficulty with speech or problems with balance and coordination.  Psych: Denies anxiety, depression, SI/HI.  No other specific complaints in a  complete review of systems (except as listed in HPI above).     Objective:   Physical Exam  BP 112/72 (BP Location: Left Arm, Patient Position: Sitting, Cuff Size: Normal)   Pulse 66   Temp (!) 96.9 F (36.1 C) (Temporal)   Ht '5\' 10"'  (1.778 m)   Wt 205 lb (93 kg)   SpO2 97%   BMI 29.41 kg/m   Wt Readings from Last 3 Encounters:  08/22/21 208 lb (94.3 kg)  04/17/21 218 lb (98.9 kg)  08/15/20 214 lb (97.1 kg)    General: Appears their stated age, overweight, n NAD. Skin: Warm, dry and intact.  HEENT: Head: normal shape and size; Eyes: sclera white, no icterus,  conjunctiva pink, PERRLA and EOMs intact;  Neck:  Neck supple, trachea midline. No masses, lumps or thyromegaly present.  Cardiovascular: Normal rate and rhythm. S1,S2 noted.  No murmur, rubs or gallops noted. No JVD or BLE edema. No carotid bruits noted. Pulmonary/Chest: Normal effort and positive vesicular breath sounds. No respiratory distress. No wheezes, rales or ronchi noted.  Abdomen:  Normal bowel sounds.  Musculoskeletal: Strength 5/5 BUE/BLE.  No difficulty with gait.  Neurological: Alert and oriented. Cranial nerves II-XII grossly intact. Coordination normal.  Psychiatric: Mood and affect normal. Behavior is normal. Judgment and thought content normal.    BMET    Component Value Date/Time   NA 139 05/19/2020 0857   K 5.0 05/19/2020 0857   CL 103 05/19/2020 0857   CO2 28 05/19/2020 0857   GLUCOSE 81 05/19/2020 0857   BUN 12 05/19/2020 0857   CREATININE 0.61 05/19/2020 0857   CALCIUM 9.9 05/19/2020 0857   GFRNONAA 104 05/19/2020 0857   GFRAA 121 05/19/2020 0857    Lipid Panel     Component Value Date/Time   CHOL 205 (H) 05/19/2020 0857   TRIG 102 05/19/2020 0857   HDL 55 05/19/2020 0857   CHOLHDL 3.7 05/19/2020 0857   LDLCALC 130 (H) 05/19/2020 0857    CBC    Component Value Date/Time   WBC 9.5 05/19/2020 0857   RBC 4.36 05/19/2020 0857   HGB 13.7 05/19/2020 0857   HCT 40.4 05/19/2020 0857   PLT 402 (H) 05/19/2020 0857   MCV 92.7 05/19/2020 0857   MCH 31.4 05/19/2020 0857   MCHC 33.9 05/19/2020 0857   RDW 11.8 05/19/2020 0857   LYMPHSABS 2,328 05/19/2020 0857   EOSABS 171 05/19/2020 0857   BASOSABS 86 05/19/2020 0857    Hgb A1C No results found for: "HGBA1C"         Assessment & Plan:   Preventative Health Maintenance:  Encouraged her to get a flu shot in the fall Tetanus today Encouraged her to get her COVID booster Discussed Shingrix vaccine, she will check coverage with her insurance company and schedule a nurse visit if she would like to  have this done Pap smear UTD Mammogram ordered-she will call to schedule Colon screening UTD Encouraged her to consume a balanced diet and exercise regimen Advised her to see an eye doctor and dentist annually We will check CBC, c-Met, lipid, A1c, and hep C today  RTC in 6 months, follow-up chronic conditions Webb Silversmith, NP

## 2021-11-13 NOTE — Assessment & Plan Note (Signed)
Encourage diet and exercise for weight loss 

## 2021-11-15 ENCOUNTER — Encounter: Payer: Self-pay | Admitting: Internal Medicine

## 2021-11-15 LAB — LIPID PANEL
Cholesterol: 212 mg/dL — ABNORMAL HIGH (ref <200)
HDL: 58 mg/dL (ref >=50)
LDL Cholesterol (Calc): 137 mg/dL (calc) — ABNORMAL HIGH
Non-HDL Cholesterol (Calc): 154 mg/dL (calc) — ABNORMAL HIGH (ref <130)
Total CHOL/HDL Ratio: 3.7 (calc) (ref <5.0)
Triglycerides: 75 mg/dL (ref <150)

## 2021-11-15 LAB — CBC
HCT: 41.4 % (ref 35.0–45.0)
Hemoglobin: 13.7 g/dL (ref 11.7–15.5)
MCH: 31.2 pg (ref 27.0–33.0)
MCHC: 33.1 g/dL (ref 32.0–36.0)
MCV: 94.3 fL (ref 80.0–100.0)
MPV: 12.6 fL — ABNORMAL HIGH (ref 7.5–12.5)
Platelets: 379 10*3/uL (ref 140–400)
RBC: 4.39 10*6/uL (ref 3.80–5.10)
RDW: 13.3 % (ref 11.0–15.0)
WBC: 11.7 10*3/uL — ABNORMAL HIGH (ref 3.8–10.8)

## 2021-11-15 LAB — HEMOGLOBIN A1C
Hgb A1c MFr Bld: 5.3 % of total Hgb (ref <5.7)
Mean Plasma Glucose: 105 mg/dL
eAG (mmol/L): 5.8 mmol/L

## 2021-11-15 LAB — COMPLETE METABOLIC PANEL WITH GFR
AG Ratio: 1.2 (calc) (ref 1.0–2.5)
ALT: 31 U/L — ABNORMAL HIGH (ref 6–29)
AST: 21 U/L (ref 10–35)
Albumin: 4.2 g/dL (ref 3.6–5.1)
Alkaline phosphatase (APISO): 82 U/L (ref 37–153)
BUN: 10 mg/dL (ref 7–25)
CO2: 26 mmol/L (ref 20–32)
Calcium: 9.9 mg/dL (ref 8.6–10.4)
Chloride: 105 mmol/L (ref 98–110)
Creat: 0.56 mg/dL (ref 0.50–1.03)
Globulin: 3.5 g/dL (calc) (ref 1.9–3.7)
Glucose, Bld: 98 mg/dL (ref 65–139)
Potassium: 4.8 mmol/L (ref 3.5–5.3)
Sodium: 138 mmol/L (ref 135–146)
Total Bilirubin: 0.7 mg/dL (ref 0.2–1.2)
Total Protein: 7.7 g/dL (ref 6.1–8.1)
eGFR: 108 mL/min/{1.73_m2} (ref >=60)

## 2021-11-15 LAB — TEST AUTHORIZATION: TEST CODE:: 16802

## 2021-11-15 LAB — HEPATITIS C ANTIBODY: Hepatitis C Ab: NONREACTIVE

## 2021-12-08 LAB — HM MAMMOGRAPHY

## 2021-12-25 ENCOUNTER — Encounter: Payer: BC Managed Care – PPO | Admitting: Podiatry

## 2022-08-01 ENCOUNTER — Ambulatory Visit: Payer: BC Managed Care – PPO | Admitting: Family Medicine

## 2022-08-01 VITALS — BP 124/78 | HR 65 | Ht 70.0 in | Wt 209.0 lb

## 2022-08-01 DIAGNOSIS — N3001 Acute cystitis with hematuria: Secondary | ICD-10-CM

## 2022-08-01 LAB — POCT URINALYSIS DIPSTICK
Bilirubin, UA: NEGATIVE
Glucose, UA: NEGATIVE
Ketones, UA: NEGATIVE
Nitrite, UA: POSITIVE — AB
Protein, UA: NEGATIVE
Spec Grav, UA: >=1.03 — AB (ref 1.010–1.025)
Urobilinogen, UA: 0.2 E.U./dL
pH, UA: >=9 — AB (ref 5.0–8.0)

## 2022-08-01 MED ORDER — CEPHALEXIN 500 MG PO CAPS: 500.0000 mg | ORAL_CAPSULE | Freq: Three times a day (TID) | ORAL | 0 refills | Status: DC

## 2022-08-01 NOTE — Progress Notes (Signed)
Subjective:    Patient ID: Teresa Abbott, female    DOB: 24-Mar-1966, 57 y.o.   MRN: [id number]  Teresa Abbott is a 57 y.o. female presenting on 08/01/2022 for Urinary Tract Infection  PCP Webb Silversmith, FNP   Patient presents for a same day appointment.   HPI  UTI Reports symptoms onset over past 1 month has had some symptoms of waking up to void 1st time in AM, may have some dysuria and symptoms, then it would resolve - Now today feels worse with persistent symptoms with UTI now with dysuria, frequency, pelvic discomfort,  History of UTI, infrequently Took OTC AZO this morning did not help at all Denies any hematuria, fevers chills      08/01/2022    2:08 PM 11/13/2021    9:50 AM 11/13/2021    8:41 AM  Depression screen PHQ 2/9  Decreased Interest 2 0 0  Down, Depressed, Hopeless 2 0 0  PHQ - 2 Score 4 0 0  Altered sleeping 3 1   Tired, decreased energy 3 0   Change in appetite 3 1   Feeling bad or failure about yourself  0 0   Trouble concentrating 2 0   Moving slowly or fidgety/restless 0 0   Suicidal thoughts 0 0   PHQ-9 Score 15 2   Difficult doing work/chores Very difficult Not difficult at all     Social History   Tobacco Use   Smoking status: Former    Packs/day: 0.50    Years: 8.00    Additional pack years: 0.00    Total pack years: 4.00    Types: Cigarettes    Quit date: 04/18/1990    Years since quitting: 32.3   Smokeless tobacco: Never  Vaping Use   Vaping Use: Never used  Substance Use Topics   Alcohol use: Yes    Alcohol/week: 2.0 standard drinks of alcohol    Types: 2 Standard drinks or equivalent per week    Comment: OCC   Drug use: Not Currently    Types: Marijuana    Review of Systems Per HPI unless specifically indicated above     Objective:    BP 124/78   Pulse 65   Ht 5\' 10"  (1.778 m)   Wt 209 lb (94.8 kg)   SpO2 99%   BMI 29.99 kg/m   Wt Readings from Last 3 Encounters:  08/01/22 209 lb (94.8 kg)  11/13/21 205 lb (93  kg)  08/22/21 208 lb (94.3 kg)    Physical Exam Vitals and nursing note reviewed.  Constitutional:      General: She is not in acute distress.    Appearance: Normal appearance. She is well-developed. She is not diaphoretic.     Comments: Well-appearing, comfortable, cooperative  HENT:     Head: Normocephalic and atraumatic.  Eyes:     General:        Right eye: No discharge.        Left eye: No discharge.     Conjunctiva/sclera: Conjunctivae normal.  Cardiovascular:     Rate and Rhythm: Normal rate.  Pulmonary:     Effort: Pulmonary effort is normal.  Skin:    General: Skin is warm and dry.     Findings: No erythema or rash.  Neurological:     Mental Status: She is alert and oriented to person, place, and time.  Psychiatric:        Mood and Affect: Mood normal.  Behavior: Behavior normal.        Thought Content: Thought content normal.     Comments: Well groomed, good eye contact, normal speech and thoughts      Results for orders placed or performed in visit on 08/01/22  POCT Urinalysis Dipstick  Result Value Ref Range   Color, UA Orange    Clarity, UA Cloudy    Glucose, UA Negative Negative   Bilirubin, UA Negative    Ketones, UA Negative    Spec Grav, UA >=1.030 (A) 1.010 - 1.025   Blood, UA Small + (A)    pH, UA >=9.0 (A) 5.0 - 8.0   Protein, UA Negative Negative   Urobilinogen, UA 0.2 0.2 or 1.0 E.U./dL   Nitrite, UA Positive (A)    Leukocytes, UA Small (1+) (A) Negative   Appearance     Odor        Assessment & Plan:   Problem List Items Addressed This Visit   None Visit Diagnoses     Acute cystitis with hematuria    -  Primary   Relevant Medications   cephALEXin (KEFLEX) 500 MG capsule   Other Relevant Orders   POCT Urinalysis Dipstick (Completed)   Urine Culture       Clinically consistent with UTI and confirmed on UA. No recent antibiotics No regular history of UTIs No concern for pyelo today (no systemic symptoms, neg fever, back  pain, n/v).  Plan: Urine dipstick positive w/ nitrites leuks rbc 2. Ordered Urine culture 3. Keflex 500mg  TID x 7 days 4. Improve PO hydration - Counsel to avoid AZO per AVS, short term only PRN 5. Follow up if not improving.   Orders Placed This Encounter  Procedures   Urine Culture   POCT Urinalysis Dipstick     Meds ordered this encounter  Medications   cephALEXin (KEFLEX) 500 MG capsule    Sig: Take 1 capsule (500 mg total) by mouth 3 (three) times daily. For 7 days    Dispense:  21 capsule    Refill:  0      Follow up plan: Return if symptoms worsen or fail to improve.  Nobie Putnam, [facility] 08/01/2022, 2:16 PM

## 2022-08-01 NOTE — Patient Instructions (Addendum)
Thank you for coming to the office today.  1. You have a Urinary Tract Infection - this is very common, your symptoms are reassuring and you should get better within 1 week on the antibiotics - Start Keflex 500mg  3 times daily for next 7 days, complete entire course, even if feeling better - We sent urine for a culture, we will call you within next few days if we need to change antibiotics - Please drink plenty of fluids, improve hydration over next 1 week  If symptoms worsening, developing nausea / vomiting, worsening back pain, fevers / chills / sweats, then please return for re-evaluation sooner.  If you take AZO OTC - limit this to 2-3 days MAX to avoid affecting kidneys  D-Mannose is a natural supplement that can actually help bind to urinary bacteria and reduce their effectiveness it can help prevent UTI from forming, and may reduce some symptoms. It likely cannot cure an active UTI but it is worth a try and good to prevent them with. Try 500mg  twice a day at a full dose if you want, or check package instructions for more info    Please schedule a Follow-up Appointment to: No follow-ups on file.  If you have any other questions or concerns, please feel free to call the office or send a message through [facility]. You may also schedule an earlier appointment if necessary.  Additionally, you may be receiving a survey about your experience at our office within a few days to 1 week by e-mail or mail. We value your feedback.  Nobie Putnam, DO [place]

## 2022-08-02 LAB — URINE CULTURE
MICRO NUMBER:: 14719052
SPECIMEN QUALITY:: ADEQUATE

## 2022-09-11 ENCOUNTER — Encounter: Payer: Self-pay | Admitting: Internal Medicine

## 2022-09-11 ENCOUNTER — Ambulatory Visit: Payer: Self-pay | Admitting: *Deleted

## 2022-09-11 ENCOUNTER — Ambulatory Visit: Payer: BC Managed Care – PPO | Admitting: Internal Medicine

## 2022-09-11 VITALS — BP 114/62 | HR 66 | Temp 96.8°F | Wt 208.0 lb

## 2022-09-11 DIAGNOSIS — E7800 Pure hypercholesterolemia, unspecified: Secondary | ICD-10-CM

## 2022-09-11 DIAGNOSIS — N3001 Acute cystitis with hematuria: Secondary | ICD-10-CM

## 2022-09-11 DIAGNOSIS — M25552 Pain in left hip: Secondary | ICD-10-CM | POA: Diagnosis not present

## 2022-09-11 DIAGNOSIS — M5416 Radiculopathy, lumbar region: Secondary | ICD-10-CM | POA: Diagnosis not present

## 2022-09-11 DIAGNOSIS — F325 Major depressive disorder, single episode, in full remission: Secondary | ICD-10-CM

## 2022-09-11 DIAGNOSIS — Z6829 Body mass index (BMI) 29.0-29.9, adult: Secondary | ICD-10-CM

## 2022-09-11 DIAGNOSIS — G8929 Other chronic pain: Secondary | ICD-10-CM

## 2022-09-11 DIAGNOSIS — E663 Overweight: Secondary | ICD-10-CM | POA: Diagnosis not present

## 2022-09-11 MED ORDER — NITROFURANTOIN MONOHYD MACRO 100 MG PO CAPS: 100.0000 mg | ORAL_CAPSULE | Freq: Two times a day (BID) | ORAL | 0 refills | Status: DC

## 2022-09-11 NOTE — Patient Instructions (Signed)
Urinary Tract Infection, Adult  A urinary tract infection (UTI) is an infection of any part of the urinary tract. The urinary tract includes the kidneys, ureters, bladder, and urethra. These organs make, store, and get rid of urine in the body. An upper UTI affects the ureters and kidneys. A lower UTI affects the bladder and urethra. What are the causes? Most urinary tract infections are caused by bacteria in your genital area around your urethra, where urine leaves your body. These bacteria grow and cause inflammation of your urinary tract. What increases the risk? You are more likely to develop this condition if: You have a urinary catheter that stays in place. You are not able to control when you urinate or have a bowel movement (incontinence). You are female and you: Use a spermicide or diaphragm for birth control. Have low estrogen levels. Are pregnant. You have certain genes that increase your risk. You are sexually active. You take antibiotic medicines. You have a condition that causes your flow of urine to slow down, such as: An enlarged prostate, if you are female. Blockage in your urethra. A kidney stone. A nerve condition that affects your bladder control (neurogenic bladder). Not getting enough to drink, or not urinating often. You have certain medical conditions, such as: Diabetes. A weak disease-fighting system (immunesystem). Sickle cell disease. Gout. Spinal cord injury. What are the signs or symptoms? Symptoms of this condition include: Needing to urinate right away (urgency). Frequent urination. This may include small amounts of urine each time you urinate. Pain or burning with urination. Blood in the urine. Urine that smells bad or unusual. Trouble urinating. Cloudy urine. Vaginal discharge, if you are female. Pain in the abdomen or the lower back. You may also have: Vomiting or a decreased appetite. Confusion. Irritability or tiredness. A fever or  chills. Diarrhea. The first symptom in older adults may be confusion. In some cases, they may not have any symptoms until the infection has worsened. How is this diagnosed? This condition is diagnosed based on your medical history and a physical exam. You may also have other tests, including: Urine tests. Blood tests. Tests for STIs (sexually transmitted infections). If you have had more than one UTI, a cystoscopy or imaging studies may be done to determine the cause of the infections. How is this treated? Treatment for this condition includes: Antibiotic medicine. Over-the-counter medicines to treat discomfort. Drinking enough water to stay hydrated. If you have frequent infections or have other conditions such as a kidney stone, you may need to see a health care provider who specializes in the urinary tract (urologist). In rare cases, urinary tract infections can cause sepsis. Sepsis is a life-threatening condition that occurs when the body responds to an infection. Sepsis is treated in the hospital with IV antibiotics, fluids, and other medicines. Follow these instructions at home:  Medicines Take over-the-counter and prescription medicines only as told by your health care provider. If you were prescribed an antibiotic medicine, take it as told by your health care provider. Do not stop using the antibiotic even if you start to feel better. General instructions Make sure you: Empty your bladder often and completely. Do not hold urine for long periods of time. Empty your bladder after sex. Wipe from front to back after urinating or having a bowel movement if you are female. Use each tissue only one time when you wipe. Drink enough fluid to keep your urine pale yellow. Keep all follow-up visits. This is important. Contact a health   care provider if: Your symptoms do not get better after 1-2 days. Your symptoms go away and then return. Get help right away if: You have severe pain in  your back or your lower abdomen. You have a fever or chills. You have nausea or vomiting. Summary A urinary tract infection (UTI) is an infection of any part of the urinary tract, which includes the kidneys, ureters, bladder, and urethra. Most urinary tract infections are caused by bacteria in your genital area. Treatment for this condition often includes antibiotic medicines. If you were prescribed an antibiotic medicine, take it as told by your health care provider. Do not stop using the antibiotic even if you start to feel better. Keep all follow-up visits. This is important. This information is not intended to replace advice given to you by your health care provider. Make sure you discuss any questions you have with your health care provider. Document Revised: 12/11/2019 Document Reviewed: 12/11/2019 Elsevier Patient Education  2023 Elsevier Inc.  

## 2022-09-11 NOTE — Assessment & Plan Note (Signed)
Will check lipid profile annual exam Encouraged her to consume low-fat diet 

## 2022-09-11 NOTE — Progress Notes (Signed)
HPI  Pt presents to the clinic today for follow-up of chronic conditions.    Lumbar Radiculopathy/Chronic Left Hip Pain: She is not taking any medications for this.  She does not follow with orthopedics.  Depression: She is not currently taking any medications for this.  She is not currently seeing a therapist.  She denies anxiety, SI/HI.  HLD: Her last LDL was 137, triglycerides 75, 11/2021.  She is not taking cholesterol-lowering medication at this time.  She does not consume low-fat diet.  She also c/o urinary urgency, frequency, dysuria and blood in her urine. This started 4 days ago. She denies vaginal symptoms, fever, chills, nausea but has had low back pain. She has not tried anything OTC.  She was treated for UTI 3/20 with Keflex but urine culture sure was negative for infection. She has no history of kidney stones. She is postmenopausal. She has not family history of bladder cancer.   Review of Systems   Past Medical History:  Diagnosis Date   Abnormal vaginal Pap smear    Arthritis    RIGHT WRIST   History of ITP     Current Outpatient Medications  Medication Sig Dispense Refill   cephALEXin (KEFLEX) 500 MG capsule Take 1 capsule (500 mg total) by mouth 3 (three) times daily. For 7 days 21 capsule 0   No current facility-administered medications for this visit.    No Known Allergies  Family History  Problem Relation Age of Onset   Breast cancer Mother 86   Healthy Sister    Heart disease Brother     Social History   Socioeconomic History   Marital status: Married    Spouse name: Not on file   Number of children: Not on file   Years of education: Not on file   Highest education level: Master's degree (e.g., MA, MS, MEng, MEd, MSW, MBA)  Occupational History   Not on file  Tobacco Use   Smoking status: Former    Packs/day: 0.50    Years: 8.00    Additional pack years: 0.00    Total pack years: 4.00    Types: Cigarettes    Quit date: 04/18/1990    Years  since quitting: 32.4   Smokeless tobacco: Never  Vaping Use   Vaping Use: Never used  Substance and Sexual Activity   Alcohol use: Yes    Alcohol/week: 2.0 standard drinks of alcohol    Types: 2 Standard drinks or equivalent per week    Comment: OCC   Drug use: Not Currently    Types: Marijuana   Sexual activity: Yes    Birth control/protection: Pill  Other Topics Concern   Not on file  Social History Narrative   Not on file   Social Determinants of Health   Financial Resource Strain: Low Risk  (08/01/2022)   Overall Financial Resource Strain (CARDIA)    Difficulty of Paying Living Expenses: Not hard at all  Food Insecurity: No Food Insecurity (08/01/2022)   Hunger Vital Sign    Worried About Running Out of Food in the Last Year: Never true    Ran Out of Food in the Last Year: Never true  Transportation Needs: No Transportation Needs (08/01/2022)   PRAPARE - Administrator, Civil Service (Medical): No    Lack of Transportation (Non-Medical): No  Physical Activity: Insufficiently Active (08/01/2022)   Exercise Vital Sign    Days of Exercise per Week: 1 day    Minutes of Exercise per  Session: 10 min  Stress: Stress Concern Present (08/01/2022)   [employer] of Occupational Health - Occupational Stress Questionnaire    Feeling of Stress : To some extent  Social Connections: Moderately Integrated (08/01/2022)   Social Connection and Isolation Panel [NHANES]    Frequency of Communication with Friends and Family: Twice a week    Frequency of Social Gatherings with Friends and Family: Once a week    Attends Religious Services: More than 4 times per year    Active Member of [employer] or Organizations: No    Attends Engineer, structural: Not on file    Marital Status: Married  Catering manager Violence: Not on file     Constitutional: Denies fever, malaise, fatigue, headache or abrupt weight changes.  HEENT: Denies eye pain, eye redness, ear pain, ringing in  the ears, wax buildup, runny nose, nasal congestion, bloody nose, or sore throat. Respiratory: Denies difficulty breathing, shortness of breath, cough or sputum production.   Cardiovascular: Denies chest pain, chest tightness, palpitations or swelling in the hands or feet.  Gastrointestinal: Denies abdominal pain, bloating, constipation, diarrhea or blood in the stool.  GU: Patient reports urinary urgency, frequency, pain with urination and blood in urine.  Denies burning sensation, odor or discharge. Musculoskeletal: Patient reports low back pain, left hip pain.  Denies decrease in range of motion, difficulty with gait, muscle pain or joint swelling.  Skin: Denies redness, rashes, lesions or ulcercations.  Neurological: Denies dizziness, difficulty with memory, difficulty with speech or problems with balance and coordination.  Psych: Patient has a history of depression.  Denies anxiety, SI/HI.  No other specific complaints in a complete review of systems (except as listed in HPI above).    Objective:   Physical Exam  BP 114/62 (BP Location: Right Arm, Patient Position: Sitting, Cuff Size: Normal)   Pulse 66   Temp (!) 96.8 F (36 C) (Temporal)   Wt 208 lb (94.3 kg)   SpO2 100%   BMI 29.84 kg/m   Wt Readings from Last 3 Encounters:  08/01/22 209 lb (94.8 kg)  11/13/21 205 lb (93 kg)  08/22/21 208 lb (94.3 kg)    General: Appears her stated age, overweight in NAD. Skin: Warm, dry and intact.  HEENT: Head: normal shape and size; Eyes: sclera white, no icterus, conjunctiva pink, PERRLA and EOMs intact;  Cardiovascular: Normal rate and rhythm. ed. Pulmonary/Chest: Normal effort and positive vesicular breath sounds. No respiratory distress. No wheezes, rales or ronchi noted.  Abdomen: No CVA tenderness noted. Musculoskeletal:  No difficulty with gait.  Neurological: Alert and oriented. Coordination normal.  Psychiatric: Mood and affect normal. Behavior is normal. Judgment and  thought content normal.     BMET    Component Value Date/Time   NA 138 11/13/2021 0847   K 4.8 11/13/2021 0847   CL 105 11/13/2021 0847   CO2 26 11/13/2021 0847   GLUCOSE 98 11/13/2021 0847   BUN 10 11/13/2021 0847   CREATININE 0.56 11/13/2021 0847   CALCIUM 9.9 11/13/2021 0847   GFRNONAA 104 05/19/2020 0857   GFRAA 121 05/19/2020 0857    Lipid Panel     Component Value Date/Time   CHOL 212 (H) 11/13/2021 0847   TRIG 75 11/13/2021 0847   HDL 58 11/13/2021 0847   CHOLHDL 3.7 11/13/2021 0847   LDLCALC 137 (H) 11/13/2021 0847    CBC    Component Value Date/Time   WBC 11.7 (H) 11/13/2021 0847   RBC 4.39 11/13/2021 0847  HGB 13.7 11/13/2021 0847   HCT 41.4 11/13/2021 0847   PLT 379 11/13/2021 0847   MCV 94.3 11/13/2021 0847   MCH 31.2 11/13/2021 0847   MCHC 33.1 11/13/2021 0847   RDW 13.3 11/13/2021 0847   LYMPHSABS 2,328 05/19/2020 0857   EOSABS 171 05/19/2020 0857   BASOSABS 86 05/19/2020 0857    Hgb A1C Lab Results  Component Value Date   HGBA1C 5.3 11/13/2021             Assessment & Plan:   Urgency, Frequency, Dysuria, Blood in Urine secondary to Presumed UTI  Urinalysis: 2+ leuks, 3+ blood Will send urine culture eRx sent if for Macrobid 100 mg BID x 5 days OK to take AZO OTC Drink plenty of fluids  RTC in 3 months for annual exam Nicki Reaper, NP

## 2022-09-11 NOTE — Telephone Encounter (Signed)
  Chief Complaint: Blood in urine Symptoms: Had UTI 08/01/2022 took antibiotic.   Symptoms resolved.   Not they have returned.   Frequency, burning, right flank pain, blood in urine, feels like always needing to urinate, right arm tingles when she urinates. Frequency: Started this morning with blood in urine. Pertinent Negatives: Patient denies fever Disposition: [] ED /[] Urgent Care (no appt availability in office) / [x] Appointment(In office/virtual)/ []  [facility]/ [] Home Care/ [] Refused Recommended Disposition /[] [facility]/ []  Follow-up with PCP Additional Notes: Appt made for today with Nicki Reaper, NP at 10:00

## 2022-09-11 NOTE — Assessment & Plan Note (Signed)
Stable off meds. ?

## 2022-09-11 NOTE — Assessment & Plan Note (Signed)
Encouraged regular stretching Okay to take Tylenol or ibuprofen OTC as needed 

## 2022-09-11 NOTE — Assessment & Plan Note (Signed)
Encourage diet and exercise for weight loss 

## 2022-09-11 NOTE — Telephone Encounter (Signed)
Reason for Disposition  Blood in urine  (Exception: Could be normal menstrual bleeding.)  Answer Assessment - Initial Assessment Questions 1. COLOR of URINE: "Describe the color of the urine."  (e.g., tea-colored, pink, red, bloody) "Do you have blood clots in your urine?" (e.g., none, pea, grape, small coin)     I had UTI a month ago.   Seen 08/01/2022 Not   It went away.   Now this morning I have blood in my urine.    I'm having burning, frequency, feels like I have to go all the time.   My right arm tingles when I urinate.    My right lower back is hurting.    2. ONSET: "When did the bleeding start?"      This morning 3. EPISODES: "How many times has there been blood in the urine?" or "How many times today?"     Every time I go 4. PAIN with URINATION: "Is there any pain with passing your urine?" If Yes, ask: "How bad is the pain?"  (Scale 1-10; or mild, moderate, severe)    - MILD: Complains slightly about urination hurting.    - MODERATE: Interferes with normal activities.      - SEVERE: Excruciating, unwilling or unable to urinate because of the pain.      burns 5. FEVER: "Do you have a fever?" If Yes, ask: "What is your temperature, how was it measured, and when did it start?"     No 6. ASSOCIATED SYMPTOMS: "Are you passing urine more frequently than usual?"     Yes 7. OTHER SYMPTOMS: "Do you have any other symptoms?" (e.g., back/flank pain, abdomen pain, vomiting)     Right flank  8. PREGNANCY: "Is there any chance you are pregnant?" "When was your last menstrual period?"     Not asked due to age  Protocols used: Urine - Blood In-A-AH

## 2022-09-11 NOTE — Assessment & Plan Note (Signed)
Encouraged regular stretching Okay to take Tylenol or ibuprofen OTC as needed

## 2022-09-12 LAB — URINE CULTURE
MICRO NUMBER:: 14893623
Result:: NO GROWTH
SPECIMEN QUALITY:: ADEQUATE

## 2022-09-13 ENCOUNTER — Encounter: Payer: Self-pay | Admitting: Internal Medicine

## 2022-11-20 ENCOUNTER — Encounter: Payer: Self-pay | Admitting: Internal Medicine

## 2022-11-20 ENCOUNTER — Ambulatory Visit (INDEPENDENT_AMBULATORY_CARE_PROVIDER_SITE_OTHER): Payer: BC Managed Care – PPO | Admitting: Internal Medicine

## 2022-11-20 VITALS — BP 104/60 | HR 76 | Temp 96.4°F | Ht 70.5 in | Wt 209.0 lb

## 2022-11-20 DIAGNOSIS — E663 Overweight: Secondary | ICD-10-CM

## 2022-11-20 DIAGNOSIS — E7800 Pure hypercholesterolemia, unspecified: Secondary | ICD-10-CM | POA: Diagnosis not present

## 2022-11-20 DIAGNOSIS — Z0001 Encounter for general adult medical examination with abnormal findings: Secondary | ICD-10-CM | POA: Diagnosis not present

## 2022-11-20 DIAGNOSIS — Z1231 Encounter for screening mammogram for malignant neoplasm of breast: Secondary | ICD-10-CM

## 2022-11-20 DIAGNOSIS — Z6829 Body mass index (BMI) 29.0-29.9, adult: Secondary | ICD-10-CM

## 2022-11-20 DIAGNOSIS — R7309 Other abnormal glucose: Secondary | ICD-10-CM

## 2022-11-20 LAB — CBC
HCT: 40.8 % (ref 35.0–45.0)
Hemoglobin: 13.3 g/dL (ref 11.7–15.5)
MCHC: 32.6 g/dL (ref 32.0–36.0)
MCV: 94 fL (ref 80.0–100.0)
MPV: 12.3 fL (ref 7.5–12.5)
RBC: 4.34 10*6/uL (ref 3.80–5.10)
WBC: 8.3 10*3/uL (ref 3.8–10.8)

## 2022-11-20 NOTE — Patient Instructions (Signed)

## 2022-11-20 NOTE — Assessment & Plan Note (Signed)
Encouraged diet and exercise for weight loss ?

## 2022-11-20 NOTE — Progress Notes (Signed)
Subjective:    Patient ID: Teresa Abbott, female    DOB: [birth year], 57 y.o.   MRN: [id number]  HPI  Patient presents to clinic today for her annual exam.  Flu: 03/2020 Tetanus: 11/2021 COVID: Pfizer x 3 Shingrix: Never Pap smear: 05/2020 Mammogram: 11/2021 Colon screening: 06/2020, Cologuard Vision screening: annually Dentist: biannually  Diet: She does eat meat. She consumes fruits and veggies. She tries to avoid fried foods. She drinks mostly water, coffee. Exercise: [employer] intermittently  Review of Systems     Past Medical History:  Diagnosis Date   Abnormal vaginal Pap smear    Arthritis    RIGHT WRIST   History of ITP     Current Outpatient Medications  Medication Sig Dispense Refill   nitrofurantoin, macrocrystal-monohydrate, (MACROBID) 100 MG capsule Take 1 capsule (100 mg total) by mouth 2 (two) times daily. 10 capsule 0   No current facility-administered medications for this visit.    No Known Allergies  Family History  Problem Relation Age of Onset   Breast cancer Mother 53   Healthy Sister    Heart disease Brother     Social History   Socioeconomic History   Marital status: Married    Spouse name: Not on file   Number of children: Not on file   Years of education: Not on file   Highest education level: Master's degree (e.g., MA, MS, MEng, MEd, MSW, MBA)  Occupational History   Not on file  Tobacco Use   Smoking status: Former    Packs/day: 0.50    Years: 8.00    Additional pack years: 0.00    Total pack years: 4.00    Types: Cigarettes    Quit date: 04/18/1990    Years since quitting: 32.6   Smokeless tobacco: Never  Vaping Use   Vaping Use: Never used  Substance and Sexual Activity   Alcohol use: Yes    Alcohol/week: 2.0 standard drinks of alcohol    Types: 2 Standard drinks or equivalent per week    Comment: OCC   Drug use: Not Currently    Types: Marijuana   Sexual activity: Yes    Birth control/protection: Pill   Other Topics Concern   Not on file  Social History Narrative   Not on file   Social Determinants of Health   Financial Resource Strain: Low Risk  (08/01/2022)   Overall Financial Resource Strain (CARDIA)    Difficulty of Paying Living Expenses: Not hard at all  Food Insecurity: No Food Insecurity (08/01/2022)   Hunger Vital Sign    Worried About Running Out of Food in the Last Year: Never true    Ran Out of Food in the Last Year: Never true  Transportation Needs: No Transportation Needs (08/01/2022)   PRAPARE - Administrator, Civil Service (Medical): No    Lack of Transportation (Non-Medical): No  Physical Activity: Insufficiently Active (08/01/2022)   Exercise Vital Sign    Days of Exercise per Week: 1 day    Minutes of Exercise per Session: 10 min  Stress: Stress Concern Present (08/01/2022)   [employer] of Occupational Health - Occupational Stress Questionnaire    Feeling of Stress : To some extent  Social Connections: Moderately Integrated (08/01/2022)   Social Connection and Isolation Panel [NHANES]    Frequency of Communication with Friends and Family: Twice a week    Frequency of Social Gatherings with Friends and Family: Once a week    Attends  Religious Services: More than 4 times per year    Active Member of Clubs or Organizations: No    Attends Banker Meetings: Not on file    Marital Status: Married  Catering manager Violence: Not on file     Constitutional: Denies fever, malaise, fatigue, headache or abrupt weight changes.  HEENT: Denies eye pain, eye redness, ear pain, ringing in the ears, wax buildup, runny nose, nasal congestion, bloody nose, or sore throat. Respiratory: Denies difficulty breathing, shortness of breath, cough or sputum production.   Cardiovascular: Denies chest pain, chest tightness, palpitations or swelling in the hands or feet.  Gastrointestinal: Denies abdominal pain, bloating, constipation, diarrhea or blood in  the stool.  GU: Denies urgency, frequency, pain with urination, burning sensation, blood in urine, odor or discharge. Musculoskeletal: Patient reports chronic back and hip pain.  Denies decrease in range of motion, difficulty with gait, muscle pain or joint swelling.  Skin: Denies redness, rashes, lesions or ulcercations.  Neurological: Denies dizziness, difficulty with memory, difficulty with speech or problems with balance and coordination.  Psych: Patient has a history of depression.  Denies anxiety, SI/HI.  No other specific complaints in a complete review of systems (except as listed in HPI above).  Objective:   Physical Exam   BP 104/60 (BP Location: Left Arm, Patient Position: Sitting, Cuff Size: Large)   Pulse 76   Temp (!) 96.4 F (35.8 C) (Temporal)   Ht 5' 10.5" (1.791 m)   Wt 209 lb (94.8 kg)   SpO2 95%   BMI 29.56 kg/m   Wt Readings from Last 3 Encounters:  09/11/22 208 lb (94.3 kg)  08/01/22 209 lb (94.8 kg)  11/13/21 205 lb (93 kg)    General: Appears her stated age, overweight, in NAD. Skin: Warm, dry and intact.  Scattered scabbed lesions of bilateral upper extremities consistent with poison ivy. HEENT: Head: normal shape and size; Eyes: sclera white, no icterus, conjunctiva pink, PERRLA and EOMs intact;  Neck:  Neck supple, trachea midline. No masses, lumps or thyromegaly present.  Cardiovascular: Normal rate and rhythm. S1,S2 noted.  No murmur, rubs or gallops noted. No JVD or BLE edema. No carotid bruits noted. Pulmonary/Chest: Normal effort and positive vesicular breath sounds. No respiratory distress. No wheezes, rales or ronchi noted.  Abdomen: Soft and nontender. Normal bowel sounds.  Musculoskeletal: Strength 5/5 BUE/BLE. No difficulty with gait.  Neurological: Alert and oriented. Cranial nerves II-XII grossly intact. Coordination normal.  Psychiatric: Mood and affect normal. Behavior is normal. Judgment and thought content normal.     BMET     Component Value Date/Time   NA 138 11/13/2021 0847   K 4.8 11/13/2021 0847   CL 105 11/13/2021 0847   CO2 26 11/13/2021 0847   GLUCOSE 98 11/13/2021 0847   BUN 10 11/13/2021 0847   CREATININE 0.56 11/13/2021 0847   CALCIUM 9.9 11/13/2021 0847   GFRNONAA 104 05/19/2020 0857   GFRAA 121 05/19/2020 0857    Lipid Panel     Component Value Date/Time   CHOL 212 (H) 11/13/2021 0847   TRIG 75 11/13/2021 0847   HDL 58 11/13/2021 0847   CHOLHDL 3.7 11/13/2021 0847   LDLCALC 137 (H) 11/13/2021 0847    CBC    Component Value Date/Time   WBC 11.7 (H) 11/13/2021 0847   RBC 4.39 11/13/2021 0847   HGB 13.7 11/13/2021 0847   HCT 41.4 11/13/2021 0847   PLT 379 11/13/2021 0847   MCV 94.3 11/13/2021 0847  MCH 31.2 11/13/2021 0847   MCHC 33.1 11/13/2021 0847   RDW 13.3 11/13/2021 0847   LYMPHSABS 2,328 05/19/2020 0857   EOSABS 171 05/19/2020 0857   BASOSABS 86 05/19/2020 0857    Hgb A1C Lab Results  Component Value Date   HGBA1C 5.3 11/13/2021           Assessment & Plan:   Preventative health maintenance:  Encouraged her to get a flu shot in the fall Tetanus UTD Encouraged her to get her COVID booster Discussed Shingrix vaccine, she will check coverage with her insurance company and schedule visit if she would like to have this done Pap smear UTD Mammogram ordered-she will call to schedule Colon screening UTD Encouraged her to consume a balanced diet and exercise regimen Advised her to see an eye doctor and dentist annually We will check CBC, c-Met, lipid, A1c today  RTC in 6 months, follow-up chronic conditions Nicki Reaper, NP

## 2022-11-21 LAB — COMPLETE METABOLIC PANEL WITH GFR
AG Ratio: 1.2 (calc) (ref 1.0–2.5)
ALT: 22 U/L (ref 6–29)
AST: 19 U/L (ref 10–35)
Albumin: 3.8 g/dL (ref 3.6–5.1)
Alkaline phosphatase (APISO): 79 U/L (ref 37–153)
BUN/Creatinine Ratio: 26 (calc) — ABNORMAL HIGH (ref 6–22)
BUN: 11 mg/dL (ref 7–25)
CO2: 25 mmol/L (ref 20–32)
Calcium: 9.2 mg/dL (ref 8.6–10.4)
Chloride: 106 mmol/L (ref 98–110)
Creat: 0.43 mg/dL — ABNORMAL LOW (ref 0.50–1.03)
Globulin: 3.3 g/dL (calc) (ref 1.9–3.7)
Glucose, Bld: 78 mg/dL (ref 65–139)
Potassium: 4.2 mmol/L (ref 3.5–5.3)
Sodium: 138 mmol/L (ref 135–146)
Total Bilirubin: 0.7 mg/dL (ref 0.2–1.2)
Total Protein: 7.1 g/dL (ref 6.1–8.1)
eGFR: 115 mL/min/{1.73_m2} (ref >=60)

## 2022-11-21 LAB — CBC
MCH: 30.6 pg (ref 27.0–33.0)
Platelets: 398 10*3/uL (ref 140–400)
RDW: 13.4 % (ref 11.0–15.0)

## 2022-11-21 LAB — LIPID PANEL
Cholesterol: 177 mg/dL (ref <200)
HDL: 50 mg/dL (ref >=50)
LDL Cholesterol (Calc): 104 mg/dL (calc) — ABNORMAL HIGH
Non-HDL Cholesterol (Calc): 127 mg/dL (calc) (ref <130)
Total CHOL/HDL Ratio: 3.5 (calc) (ref <5.0)
Triglycerides: 126 mg/dL (ref <150)

## 2022-11-21 LAB — HEMOGLOBIN A1C
Hgb A1c MFr Bld: 5.5 % of total Hgb (ref <5.7)
Mean Plasma Glucose: 111 mg/dL
eAG (mmol/L): 6.2 mmol/L

## 2022-11-21 LAB — SPECIMEN COMPROMISED

## 2022-12-06 IMAGING — US US PELVIS COMPLETE WITH TRANSVAGINAL
1 series · 13 of 25 positions shown · non-contrast
Comparison: None

CLINICAL DATA: Abnormal menses

EXAM:
TRANSABDOMINAL AND TRANSVAGINAL ULTRASOUND OF PELVIS
TECHNIQUE: Both transabdominal and transvaginal ultrasound examinations of the
pelvis were performed. Transabdominal technique was performed for
global imaging of the pelvis including uterus, ovaries, adnexal
regions, and pelvic cul-de-sac. It was necessary to proceed with
endovaginal exam following the transabdominal exam to visualize the
uterus endometrium ovaries.

[Series 1: us pelvis complete with transvaginal · 0.26mm/px · 13 of 63 slices shown]
[im 1/63]
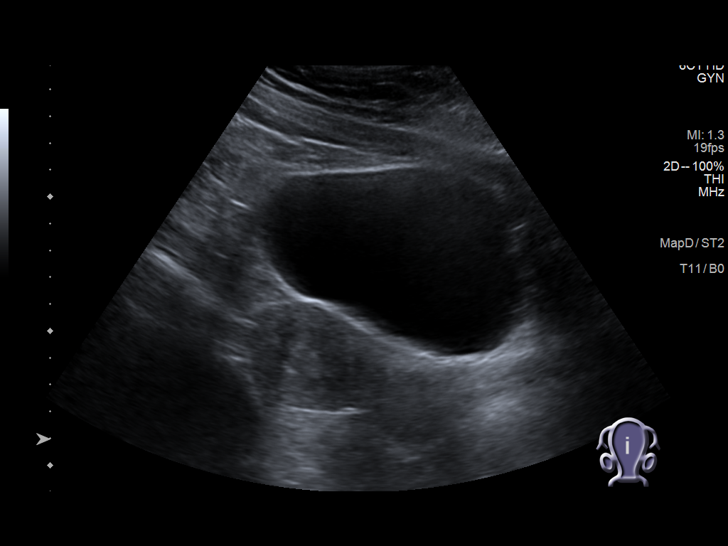
[im 6/63]
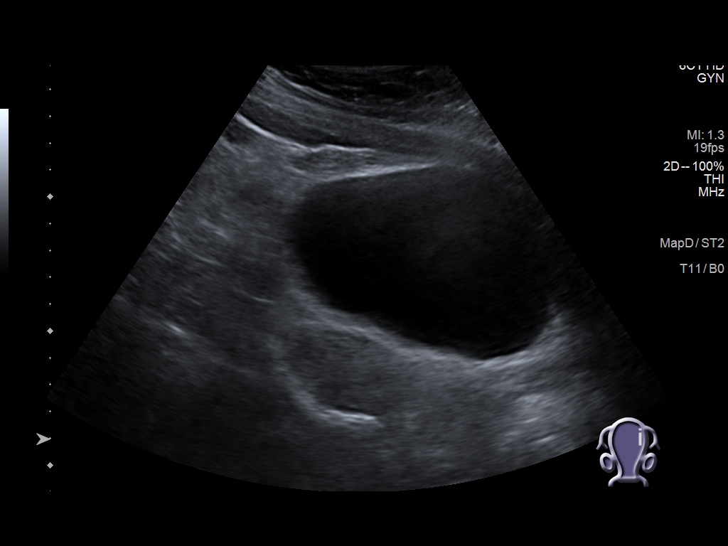
[im 11/63]
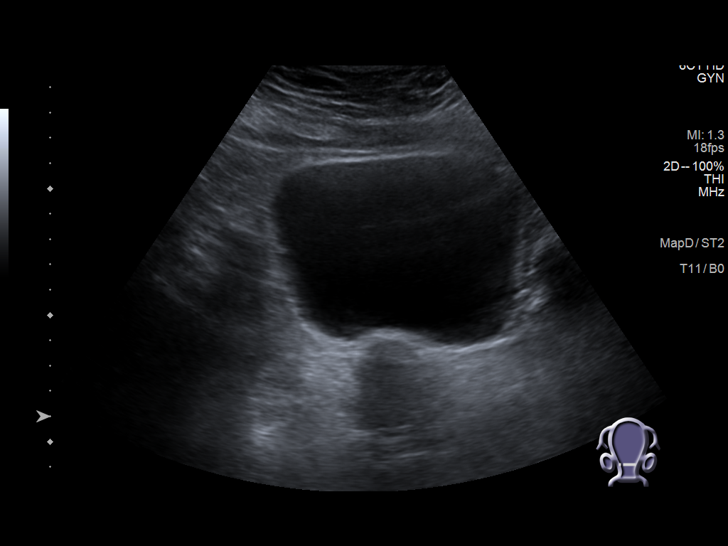
[im 16/63]
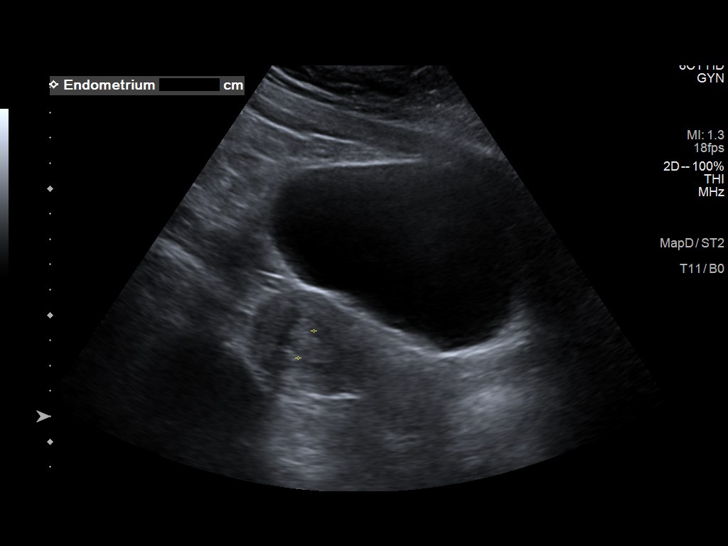
[im 21/63]
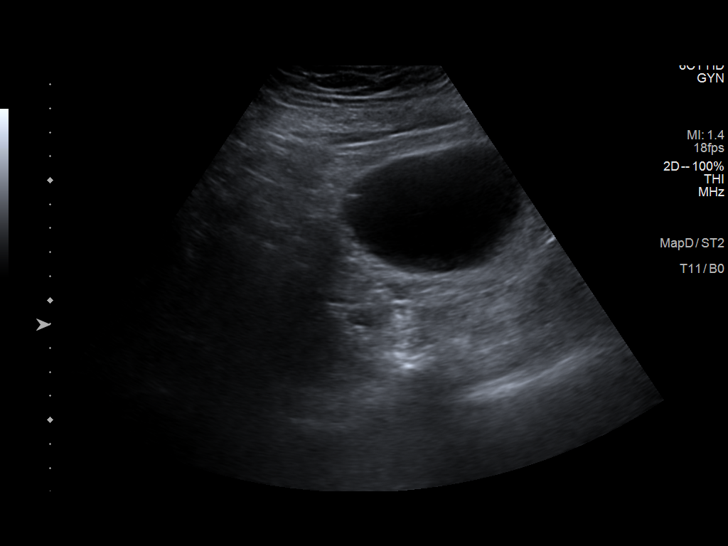
[im 26/63]
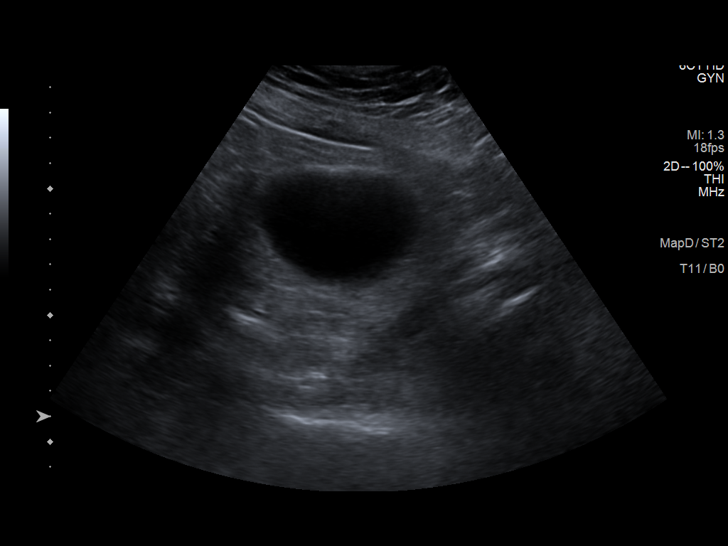
[im 32/63]
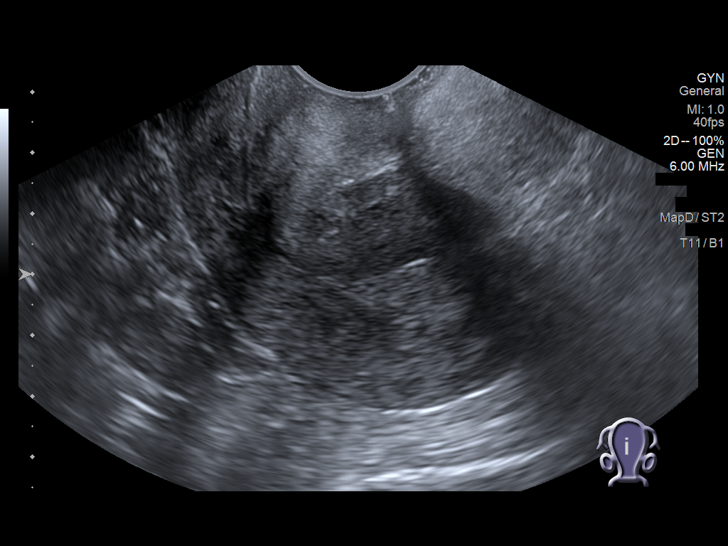
[im 37/63]
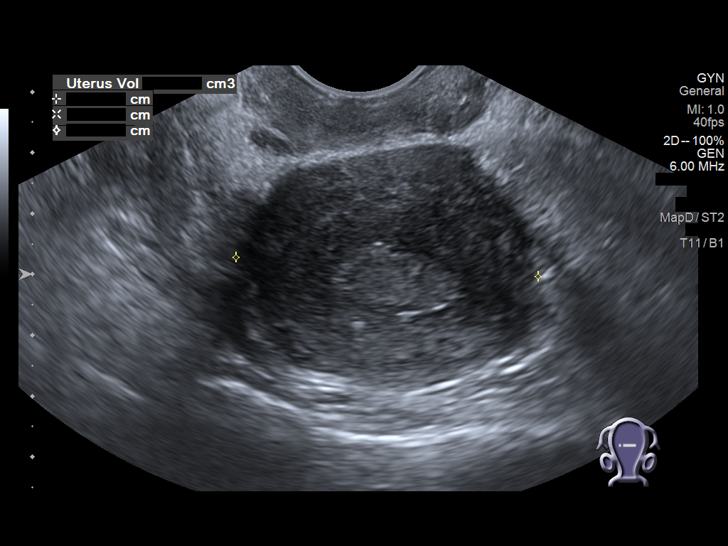
[im 42/63]
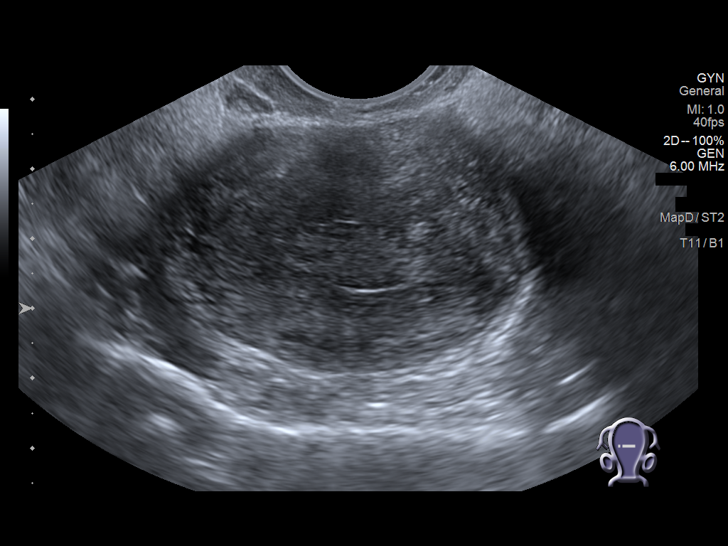
[im 47/63]
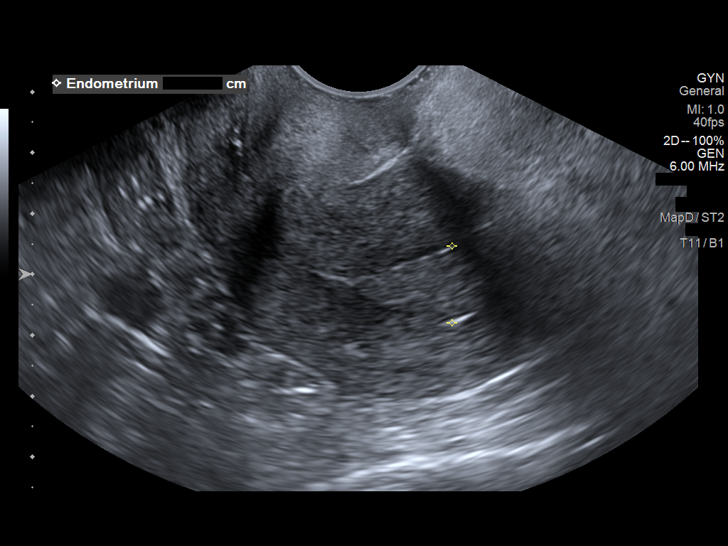
[im 52/63]
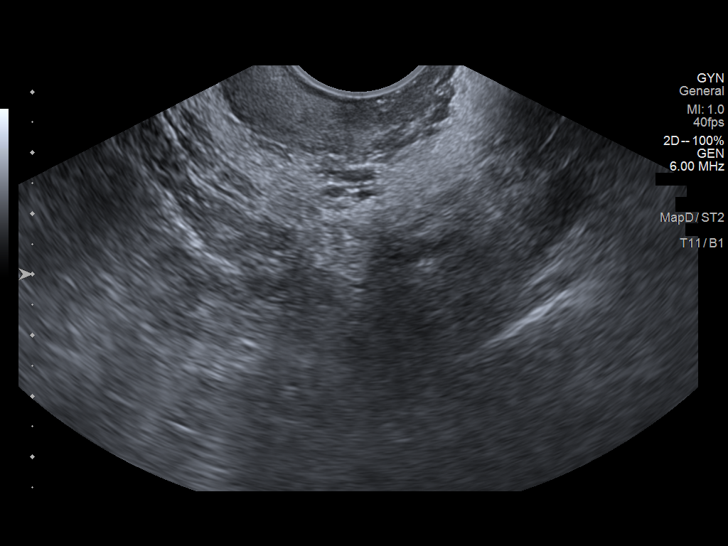
[im 57/63]
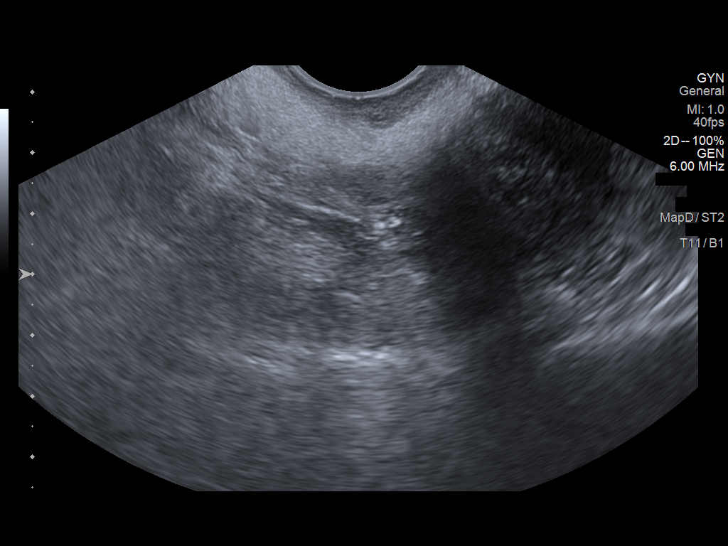
[im 63/63]
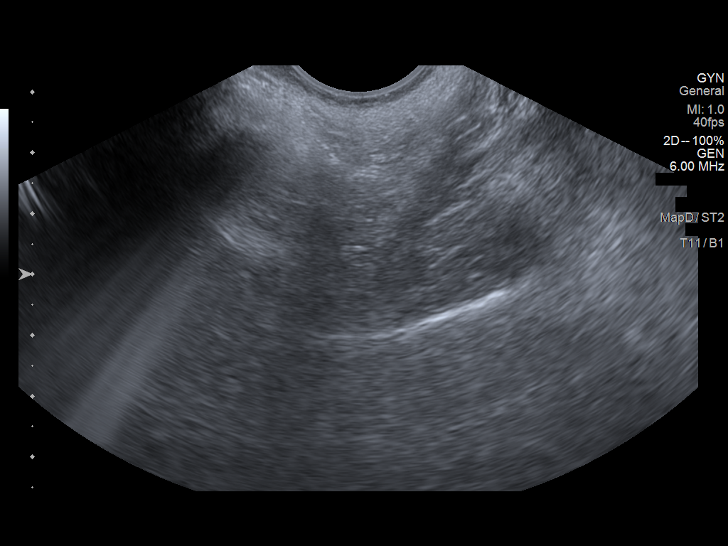

[13 of 25 positions shown; findings below may reference images not displayed]

FINDINGS: Uterus

Measurements: 6.7 x 4 x 5 cm = volume: 70 mL. No fibroids or other
mass visualized.

Endometrium

Thickness: 12.6 mm. Focally thickened endometrium/questionable mass
at the fundus measuring up to 1.3 cm in thickness.

Right ovary

Not seen

Left ovary

Not seen

Other findings

No abnormal free fluid.
IMPRESSION: 1. Focal endometrial thickening with vascularity/possible mass at
the fundus of the uterus. Consider further evaluation with
sonohysterogram for confirmation prior to hysteroscopy. Endometrial
sampling should also be considered if patient is at high risk for
endometrial carcinoma. (Ref: Radiological Reasoning: Algorithmic
Workup of Abnormal Vaginal Bleeding with Endovaginal Sonography and
Sonohysterography. AJR 3114; 191:S68-73)
2. Nonvisualized ovaries

## 2023-01-18 ENCOUNTER — Ambulatory Visit: Payer: Self-pay

## 2023-01-18 NOTE — Telephone Encounter (Signed)
  Chief Complaint: Urinary frequency, flank pain, possible fever Symptoms: above Frequency: 1 week Pertinent Negatives: Patient denies  Disposition: [] ED /[x] Urgent Care (no appt availability in office) / [] Appointment(In office/virtual)/ []  [facility]/ [] Home Care/ [] Refused Recommended Disposition /[]  Mobile [facility]/ []  Follow-up with PCP Additional Notes: Pt is recovery from COVID. Urinary s/s started 1 week ago. No appts in office pt will go to UC.    Summary: medication request   Patient stated she is experiencing pain on her right side with urination and it frequent bathroom use with very little coming out. Patient is requesting provider call her in something as the soonest appt is Monday with Dr Kirtland Bouchard. Please f/u with patient     Reason for Disposition  Fever > 100.4 F (38.0 C)  Answer Assessment - Initial Assessment Questions 1. SYMPTOM: "What's the main symptom you're concerned about?" (e.g., frequency, incontinence)     Frequency, pain side back pain, burning 2. ONSET: "When did the    start?"     1 week 3. PAIN: "Is there any pain?" If Yes, ask: "How bad is it?" (Scale: 1-10; mild, moderate, severe)     yes 4. CAUSE: "What do you think is causing the symptoms?"     unsure 5. OTHER SYMPTOMS: "Do you have any other symptoms?" (e.g., blood in urine, fever, flank pain, pain with urination)     no  Protocols used: Urinary Symptoms-A-AH

## 2023-02-22 ENCOUNTER — Encounter: Payer: Self-pay | Admitting: Internal Medicine

## 2023-02-22 ENCOUNTER — Ambulatory Visit: Payer: Self-pay

## 2023-02-22 NOTE — Telephone Encounter (Signed)
FYI

## 2023-02-22 NOTE — Telephone Encounter (Signed)
See Nurse Triage note. 

## 2023-02-22 NOTE — Telephone Encounter (Signed)
Chief Complaint: Increase urine frequency Symptoms: Increased urine frequency, mild pain with urination, right side flank pain, and mild urine odor Frequency: constant, onset yesterday Pertinent Negatives: Patient denies blood in urine, back pain, fever Disposition: [] ED /[x] Urgent Care (no appt availability in office) / [] Appointment(In office/virtual)/ []  [facility]/ [] Home Care/ [] Refused Recommended Disposition /[] [facility]/ []  Follow-up with PCP Additional Notes: Patient states she woke up around 0100 yesterday and had mild pain and discomfort with urination. Patient reports ncreased urine frequency, mild pain with urination, right side flank pain, and mild urine odor. Patient reports that she never had problems with UTIs up until this year and this would be the 4 time within a year that she is having UTI symptoms. Patient is concerned. Care advice given and no appointments are available in the office today. Offered patient the first available on 02/26/23 and patient stated she did not want to wait too long to be seen her symptoms will just get worse. Patient did state that she scheduled herself an urgent care appointment tomorrow at 0830. Patient would like to schedule a follow up appointment with PCP due to reoccurring symptoms. Offered 02/26/23 again and patient stated she needed an appointment after 1600. No appointments available after 1600. Advised I would forward message to PCP for appointment advice. Patient verbalized understanding Summary: frequent urination mild pain   Pt called in says has frequent urination and mild pain, no appt until 10/15     Reason for Disposition  Urinating more frequently than usual (i.e., frequency)  Answer Assessment - Initial Assessment Questions 1. SYMPTOM: "What's the main symptom you're concerned about?" (e.g., frequency, incontinence)     Frequency and pain 2. ONSET: "When did the  frequency  start?"     Last night 3. PAIN:  "Is there any pain?" If Yes, ask: "How bad is it?" (Scale: 1-10; mild, moderate, severe)     Yes, mild pain  4. CAUSE: "What do you think is causing the symptoms?"     A UTI 5. OTHER SYMPTOMS: "Do you have any other symptoms?" (e.g., blood in urine, fever, flank pain, pain with urination) Pain with urination, right side flank pain  Protocols used: Urinary Symptoms-A-AH

## 2023-05-24 ENCOUNTER — Ambulatory Visit: Payer: 59 | Admitting: Internal Medicine

## 2023-08-14 ENCOUNTER — Ambulatory Visit (INDEPENDENT_AMBULATORY_CARE_PROVIDER_SITE_OTHER)

## 2023-08-14 ENCOUNTER — Ambulatory Visit: Payer: Self-pay | Admitting: Podiatry

## 2023-08-14 ENCOUNTER — Encounter: Payer: Self-pay | Admitting: Podiatry

## 2023-08-14 DIAGNOSIS — M722 Plantar fascial fibromatosis: Secondary | ICD-10-CM

## 2023-08-14 MED ORDER — METHYLPREDNISOLONE 4 MG PO TBPK: ORAL_TABLET | ORAL | 0 refills | Status: DC

## 2023-08-14 MED ORDER — CELECOXIB 200 MG PO CAPS: 200.0000 mg | ORAL_CAPSULE | Freq: Two times a day (BID) | ORAL | 3 refills | Status: DC

## 2023-08-14 MED ORDER — TRIAMCINOLONE ACETONIDE 40 MG/ML IJ SUSP
20.0000 mg | Freq: Once | INTRAMUSCULAR | Status: AC
Start: 2023-08-14 — End: 2023-08-14
  Administered 2023-08-14: 20 mg

## 2023-08-14 NOTE — Progress Notes (Signed)
 She presents today with bilateral heel pain and pain along the lateral aspect of the right foot.  She states that has been going on now for about 3 months.  She wears a plantar fascia brace with some shoes but not with others because of the pain associated with it.  She said that her feet feel tired and achy.  Objective: Vital signs are stable alert and oriented x 3.  Pulses are palpable.  There is no erythema edema cellulitis drainage or odor she has reproducible pain on palpation medial calcaneal tubercle of the right heel.  No reproducible pain along the lateral aspect of the foot.  Left heel does demonstrate pain on palpation medial calcaneal tubercle more so than that of the right.  Radiographs taken today of the right foot demonstrate no significant osseous abnormalities.  It does however demonstrate soft tissue increase in density at the plantar fascial calcaneal insertion site.  Assessment: Plantar fasciitis bilateral.  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  Plan: Discussed etiology pathology conservative versus surgical therapies.  I injected the bilateral heels today 20 mg of Kenalog 5 mg Marcaine to each heel.  Started her on methylprednisolone to be followed by Celebrex 200 mg twice a day for a week then 200 mg daily after that.  I will follow-up with her in 1 month we once again discussed appropriate shoe gear.

## 2023-09-25 ENCOUNTER — Ambulatory Visit: Admitting: Podiatry

## 2023-10-17 IMAGING — US US PELVIS COMPLETE WITH TRANSVAGINAL
1 series · 13 of 25 positions shown · non-contrast
Comparison: 05/25/2020

CLINICAL DATA: Abnormal uterine bleeding, history of endometrial
polyp post polypectomy; postmenopausal



[Series 1: us pelvis complete with transvaginal · 0.23mm/px · 13 of 101 slices shown]
[im 1/101]
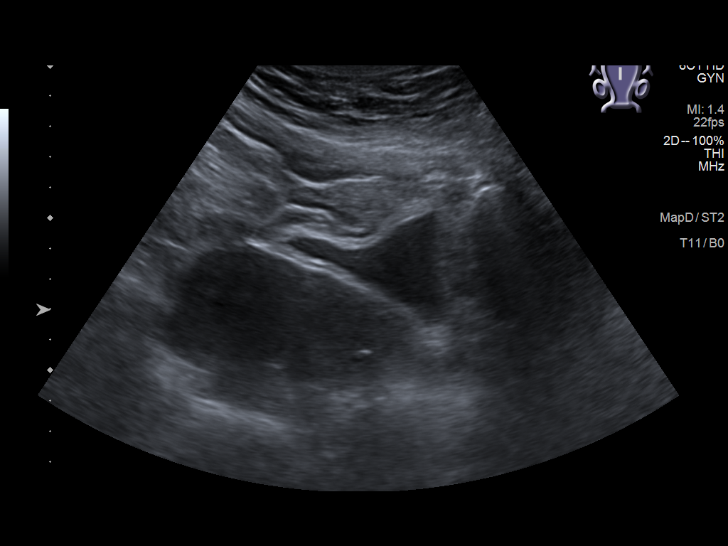
[im 9/101]
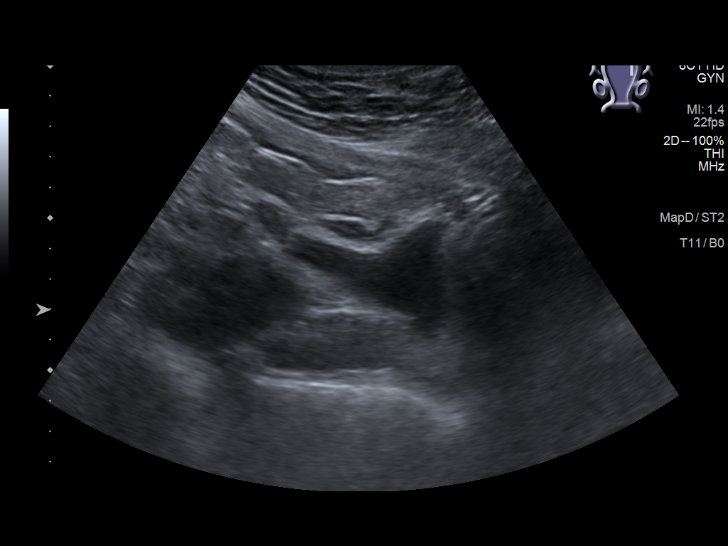
[im 17/101]
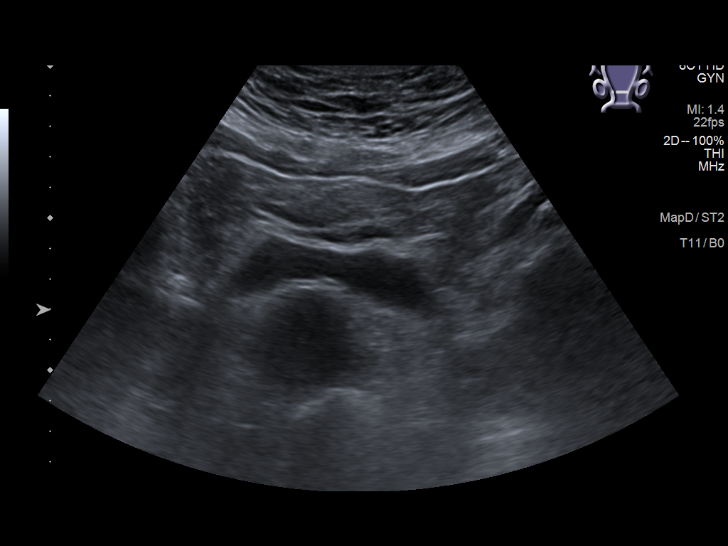
[im 26/101]
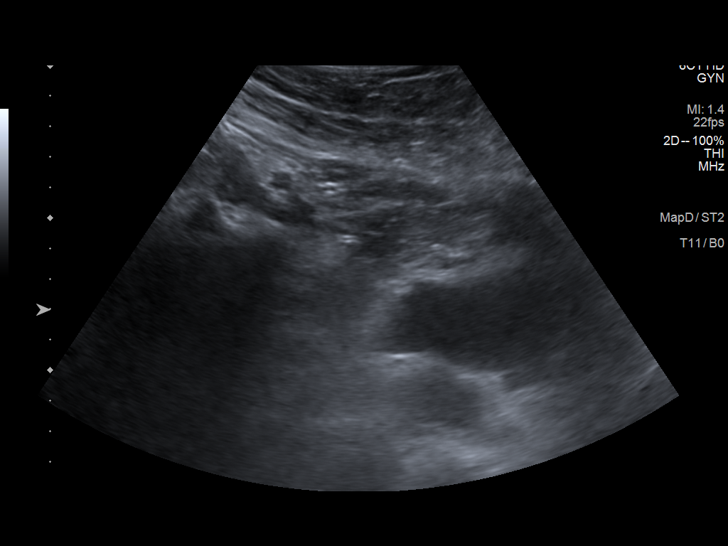
[im 34/101]
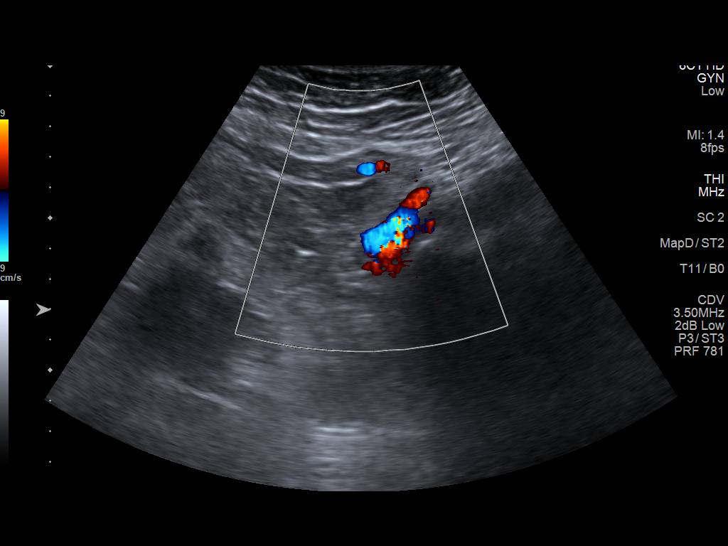
[im 42/101]
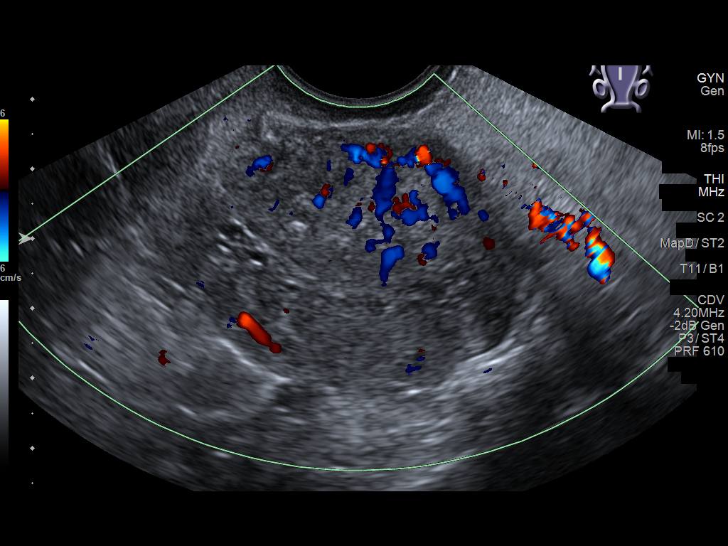
[im 51/101]
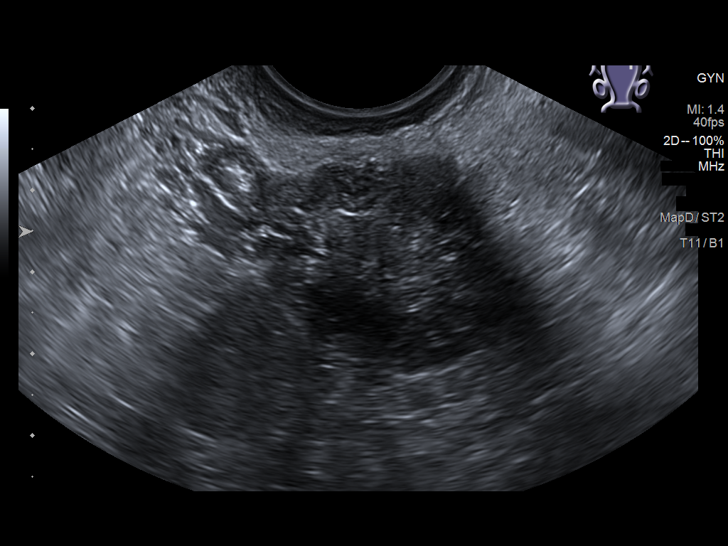
[im 59/101]
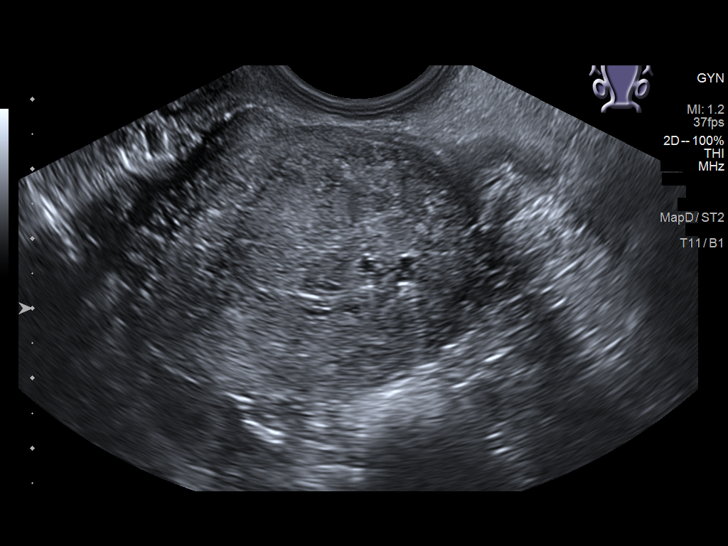
[im 67/101]
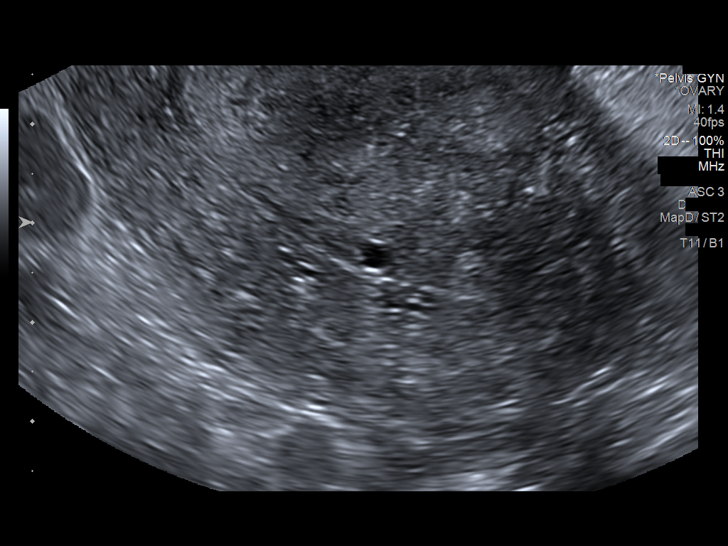
[im 76/101]
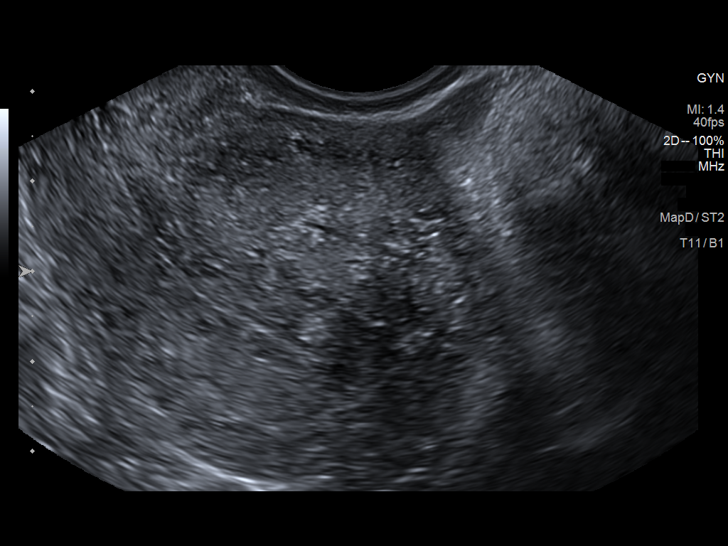
[im 84/101]
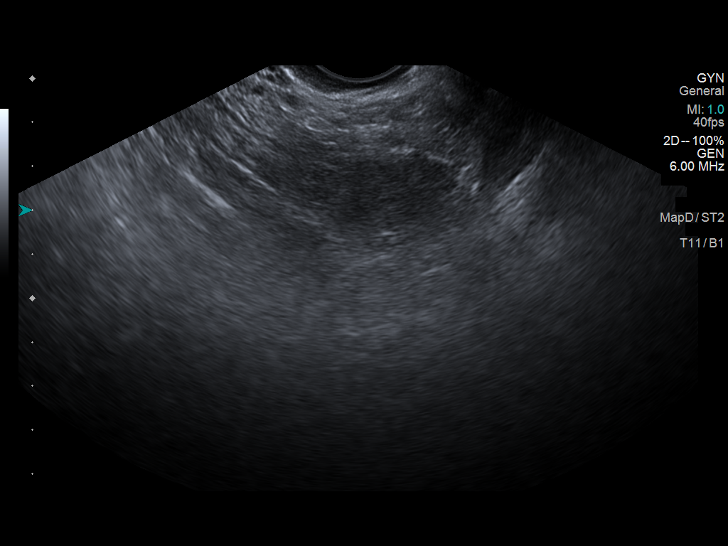
[im 92/101]
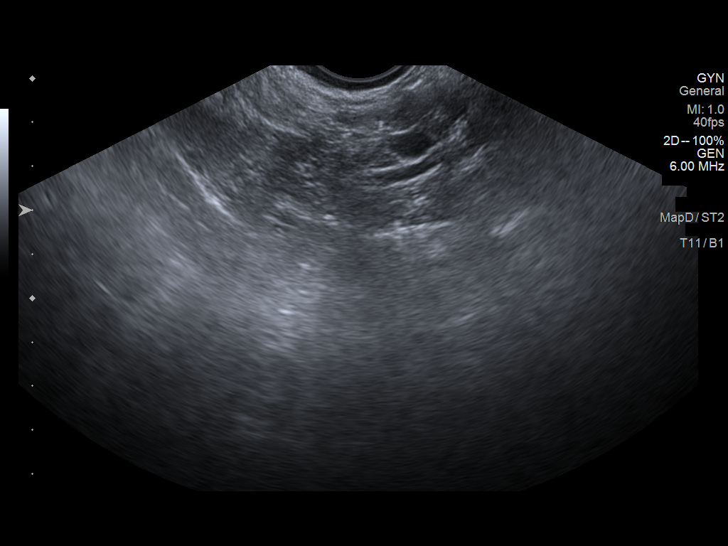
[im 101/101]
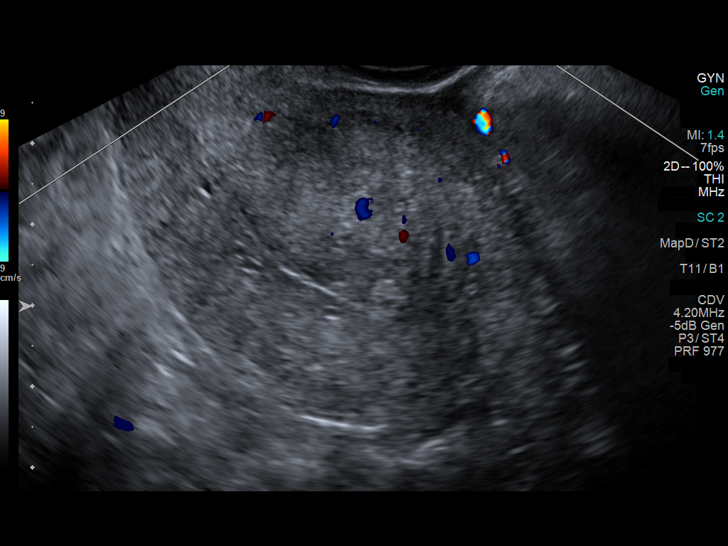

[13 of 25 positions shown; findings below may reference images not displayed]

FINDINGS: Uterus

Measurements: 7.4 x 4.3 x 4.9 cm = volume: 81 mL. Retroverted.
Heterogeneous myometrium. No focal mass.

Endometrium

Thickness: 1.3 mm. Small echogenic mass at upper uterine segment
endometrial canal, question small polyp versus submucosal leiomyoma
4 mm diameter.

Right ovary

Not visualized, likely obscured by bowel

Left ovary

Not visualized, likely obscured by bowel

Other findings

No free fluid.  No adnexal masses.
IMPRESSION: Nonvisualization of ovaries.

Questionable small 4 mm polyp versus submucosal leiomyoma at fundal
portion of endometrial canal; in the setting of post-menopausal
bleeding, endometrial sampling is indicated to exclude carcinoma. If
results are benign, sonohysterogram should be considered for focal
lesion work-up prior to hysteroscopy. (Ref: Radiological Reasoning:
Algorithmic Workup of Abnormal Vaginal Bleeding with Endovaginal
Sonography and Sonohysterography. AJR 1224; 191:S68-73) .

## 2023-11-21 ENCOUNTER — Encounter: Payer: Self-pay | Admitting: Internal Medicine

## 2023-11-26 ENCOUNTER — Encounter: Payer: Self-pay | Admitting: Internal Medicine

## 2023-11-26 ENCOUNTER — Ambulatory Visit (INDEPENDENT_AMBULATORY_CARE_PROVIDER_SITE_OTHER): Payer: Self-pay | Admitting: Internal Medicine

## 2023-11-26 VITALS — BP 108/64 | Ht 70.5 in | Wt 218.6 lb

## 2023-11-26 DIAGNOSIS — Z0001 Encounter for general adult medical examination with abnormal findings: Secondary | ICD-10-CM | POA: Diagnosis not present

## 2023-11-26 DIAGNOSIS — R739 Hyperglycemia, unspecified: Secondary | ICD-10-CM | POA: Diagnosis not present

## 2023-11-26 DIAGNOSIS — Z1211 Encounter for screening for malignant neoplasm of colon: Secondary | ICD-10-CM

## 2023-11-26 DIAGNOSIS — Z23 Encounter for immunization: Secondary | ICD-10-CM | POA: Diagnosis not present

## 2023-11-26 DIAGNOSIS — Z6830 Body mass index (BMI) 30.0-30.9, adult: Secondary | ICD-10-CM

## 2023-11-26 DIAGNOSIS — E66811 Obesity, class 1: Secondary | ICD-10-CM

## 2023-11-26 DIAGNOSIS — E6609 Other obesity due to excess calories: Secondary | ICD-10-CM

## 2023-11-26 DIAGNOSIS — E7800 Pure hypercholesterolemia, unspecified: Secondary | ICD-10-CM

## 2023-11-26 MED ORDER — PHENTERMINE-TOPIRAMATE ER 3.75-23 MG PO CP24: 1.0000 | ORAL_CAPSULE | Freq: Every day | ORAL | 0 refills | Status: DC

## 2023-11-26 NOTE — Assessment & Plan Note (Signed)
 Encouraged diet and exercise for weight loss Will trial qsymia  3.75-23 mg daily

## 2023-11-26 NOTE — Progress Notes (Signed)
 Subjective:    Patient ID: Teresa Abbott, female    DOB: 1965/12/21, 58 y.o.   MRN: [id number]  HPI  Patient presents to clinic today for annual exam.  Flu: 03/2020 Tetanus: 11/2021 COVID: Pfizer x 3 Shingrix : Never Pap smear: 05/2020 Mammogram: 11/2022 Colon screening: 06/2020, Cologuard Vision screening: annually Dentist: biannually   Diet: She does eat meat. She consumes fruits and veggies. She tries to avoid fried foods. She drinks mostly water, coffee. Exercise: [employer] intermittently  Review of Systems     Past Medical History:  Diagnosis Date   Abnormal vaginal Pap smear    Arthritis    RIGHT WRIST   History of ITP     Current Outpatient Medications  Medication Sig Dispense Refill   celecoxib  (CELEBREX ) 200 MG capsule Take 1 capsule (200 mg total) by mouth 2 (two) times daily. 60 capsule 3   methylPREDNISolone  (MEDROL  DOSEPAK) 4 MG TBPK tablet 6 day dose pack - take as directed 21 tablet 0   No current facility-administered medications for this visit.    No Known Allergies  Family History  Problem Relation Age of Onset   Breast cancer Mother 69   Dementia Father    Healthy Sister    Heart disease Brother     Social History   Socioeconomic History   Marital status: Married    Spouse name: Not on file   Number of children: Not on file   Years of education: Not on file   Highest education level: Master's degree (e.g., MA, MS, MEng, MEd, MSW, MBA)  Occupational History   Not on file  Tobacco Use   Smoking status: Former    Current packs/day: 0.00    Average packs/day: 0.5 packs/day for 8.0 years (4.0 ttl pk-yrs)    Types: Cigarettes    Start date: 04/18/1982    Quit date: 04/18/1990    Years since quitting: 33.6   Smokeless tobacco: Never  Vaping Use   Vaping status: Never Used  Substance and Sexual Activity   Alcohol use: Yes    Alcohol/week: 2.0 standard drinks of alcohol    Types: 2 Standard drinks or equivalent per week     Comment: OCC   Drug use: Not Currently    Types: Marijuana   Sexual activity: Yes    Birth control/protection: Pill  Other Topics Concern   Not on file  Social History Narrative   Not on file   Social Drivers of Health   Financial Resource Strain: Low Risk  (11/25/2023)   Overall Financial Resource Strain (CARDIA)    Difficulty of Paying Living Expenses: Not hard at all  Food Insecurity: No Food Insecurity (11/25/2023)   Hunger Vital Sign    Worried About Running Out of Food in the Last Year: Never true    Ran Out of Food in the Last Year: Never true  Transportation Needs: No Transportation Needs (11/25/2023)   PRAPARE - Administrator, Civil Service (Medical): No    Lack of Transportation (Non-Medical): No  Physical Activity: Insufficiently Active (11/25/2023)   Exercise Vital Sign    Days of Exercise per Week: 1 day    Minutes of Exercise per Session: 30 min  Stress: No Stress Concern Present (11/25/2023)   [employer] of Occupational Health - Occupational Stress Questionnaire    Feeling of Stress: Only a little  Social Connections: Socially Integrated (11/25/2023)   Social Connection and Isolation Panel    Frequency of Communication with  Friends and Family: More than three times a week    Frequency of Social Gatherings with Friends and Family: Once a week    Attends Religious Services: More than 4 times per year    Active Member of [employer] or Organizations: Yes    Attends Engineer, structural: More than 4 times per year    Marital Status: Married  Catering manager Violence: Not on file     Constitutional: Denies fever, malaise, fatigue, headache or abrupt weight changes.  HEENT: Denies eye pain, eye redness, ear pain, ringing in the ears, wax buildup, runny nose, nasal congestion, bloody nose, or sore throat. Respiratory: Denies difficulty breathing, shortness of breath, cough or sputum production.   Cardiovascular: Denies chest pain, chest tightness,  palpitations or swelling in the hands or feet.  Gastrointestinal: Denies abdominal pain, bloating, constipation, diarrhea or blood in the stool.  GU: Denies urgency, frequency, pain with urination, burning sensation, blood in urine, odor or discharge. Musculoskeletal: Patient reports chronic back and hip pain.  Denies decrease in range of motion, difficulty with gait, muscle pain or joint swelling.  Skin: Denies redness, rashes, lesions or ulcercations.  Neurological: Denies dizziness, difficulty with memory, difficulty with speech or problems with balance and coordination.  Psych: Patient has a history of depression.  Denies anxiety, SI/HI.  No other specific complaints in a complete review of systems (except as listed in HPI above).  Objective:   Physical Exam  BP 108/64 (BP Location: Left Arm, Patient Position: Sitting, Cuff Size: Normal)   Ht 5' 10.5 (1.791 m)   Wt 218 lb 9.6 oz (99.2 kg)   LMP 05/09/2020   BMI 30.92 kg/m    Wt Readings from Last 3 Encounters:  11/20/22 209 lb (94.8 kg)  09/11/22 208 lb (94.3 kg)  08/01/22 209 lb (94.8 kg)    General: Appears her stated age, obese, in NAD. Skin: Warm, dry and intact.   HEENT: Head: normal shape and size; Eyes: sclera white, no icterus, conjunctiva pink, PERRLA and EOMs intact;  Neck:  Neck supple, trachea midline. No masses, lumps or thyromegaly present.  Cardiovascular: Normal rate and rhythm. S1,S2 noted.  No murmur, rubs or gallops noted. No JVD or BLE edema. No carotid bruits noted. Pulmonary/Chest: Normal effort and positive vesicular breath sounds. No respiratory distress. No wheezes, rales or ronchi noted.  Abdomen: Soft and nontender. Normal bowel sounds.  Musculoskeletal: Strength 5/5 BUE/BLE. No difficulty with gait.  Neurological: Alert and oriented. Cranial nerves II-XII grossly intact. Coordination normal.  Psychiatric: Mood and affect normal. Behavior is normal. Judgment and thought content normal.      BMET    Component Value Date/Time   NA 138 11/20/2022 0905   K 4.2 11/20/2022 0905   CL 106 11/20/2022 0905   CO2 25 11/20/2022 0905   GLUCOSE 78 11/20/2022 0905   BUN 11 11/20/2022 0905   CREATININE 0.43 (L) 11/20/2022 0905   CALCIUM 9.2 11/20/2022 0905   GFRNONAA 104 05/19/2020 0857   GFRAA 121 05/19/2020 0857    Lipid Panel     Component Value Date/Time   CHOL 177 11/20/2022 0905   TRIG 126 11/20/2022 0905   HDL 50 11/20/2022 0905   CHOLHDL 3.5 11/20/2022 0905   LDLCALC 104 (H) 11/20/2022 0905    CBC    Component Value Date/Time   WBC 8.3 11/20/2022 0905   RBC 4.34 11/20/2022 0905   HGB 13.3 11/20/2022 0905   HCT 40.8 11/20/2022 0905   PLT 398  11/20/2022 0905   MCV 94.0 11/20/2022 0905   MCH 30.6 11/20/2022 0905   MCHC 32.6 11/20/2022 0905   RDW 13.4 11/20/2022 0905   LYMPHSABS 2,328 05/19/2020 0857   EOSABS 171 05/19/2020 0857   BASOSABS 86 05/19/2020 0857    Hgb A1C Lab Results  Component Value Date   HGBA1C 5.5 11/20/2022           Assessment & Plan:   Preventative health maintenance:  Encouraged her to get a flu shot in the fall Tetanus UTD Encouraged her to get her COVID booster Shingrix  No. 1 today, will give Shingrix  No. 2 at follow-up visit in 6 months Pap smear UTD Mammogram ordered-she will call to schedule Cologuard ordered Encouraged her to consume a balanced diet and exercise regimen Advised her to see an eye doctor and dentist annually We will check CBC, c-Met, lipid, A1c today  RTC in 6 months for follow-up of chronic conditions Angeline Laura, NP

## 2023-11-26 NOTE — Patient Instructions (Signed)
 Health Maintenance for Postmenopausal Women Menopause is a normal process in which your ability to get pregnant comes to an end. This process happens slowly over many months or years, usually between the ages of 24 and 62. Menopause is complete when you have missed your menstrual period for 12 months. It is important to talk with your health care provider about some of the most common conditions that affect women after menopause (postmenopausal women). These include heart disease, cancer, and bone loss (osteoporosis). Adopting a healthy lifestyle and getting preventive care can help to promote your health and wellness. The actions you take can also lower your chances of developing some of these common conditions. What are the signs and symptoms of menopause? During menopause, you may have the following symptoms: Hot flashes. These can be moderate or severe. Night sweats. Decrease in sex drive. Mood swings. Headaches. Tiredness (fatigue). Irritability. Memory problems. Problems falling asleep or staying asleep. Talk with your health care provider about treatment options for your symptoms. Do I need hormone replacement therapy? Hormone replacement therapy is effective in treating symptoms that are caused by menopause, such as hot flashes and night sweats. Hormone replacement carries certain risks, especially as you become older. If you are thinking about using estrogen or estrogen with progestin, discuss the benefits and risks with your health care provider. How can I reduce my risk for heart disease and stroke? The risk of heart disease, heart attack, and stroke increases as you age. One of the causes may be a change in the body's hormones during menopause. This can affect how your body uses dietary fats, triglycerides, and cholesterol. Heart attack and stroke are medical emergencies. There are many things that you can do to help prevent heart disease and stroke. Watch your blood pressure High  blood pressure causes heart disease and increases the risk of stroke. This is more likely to develop in people who have high blood pressure readings or are overweight. Have your blood pressure checked: Every 3-5 years if you are 50-75 years of age. Every year if you are 77 years old or older. Eat a healthy diet  Eat a diet that includes plenty of vegetables, fruits, low-fat dairy products, and lean protein. Do not eat a lot of foods that are high in solid fats, added sugars, or sodium. Get regular exercise Get regular exercise. This is one of the most important things you can do for your health. Most adults should: Try to exercise for at least 150 minutes each week. The exercise should increase your heart rate and make you sweat (moderate-intensity exercise). Try to do strengthening exercises at least twice each week. Do these in addition to the moderate-intensity exercise. Spend less time sitting. Even light physical activity can be beneficial. Other tips Work with your health care provider to achieve or maintain a healthy weight. Do not use any products that contain nicotine or tobacco. These products include cigarettes, chewing tobacco, and vaping devices, such as e-cigarettes. If you need help quitting, ask your health care provider. Know your numbers. Ask your health care provider to check your cholesterol and your blood sugar (glucose). Continue to have your blood tested as directed by your health care provider. Do I need screening for cancer? Depending on your health history and family history, you may need to have cancer screenings at different stages of your life. This may include screening for: Breast cancer. Cervical cancer. Lung cancer. Colorectal cancer. What is my risk for osteoporosis? After menopause, you may be  at increased risk for osteoporosis. Osteoporosis is a condition in which bone destruction happens more quickly than new bone creation. To help prevent osteoporosis or  the bone fractures that can happen because of osteoporosis, you may take the following actions: If you are 61-3 years old, get at least 1,000 mg of calcium and at least 600 international units (IU) of vitamin D per day. If you are older than age 61 but younger than age 75, get at least 1,200 mg of calcium and at least 600 international units (IU) of vitamin D per day. If you are older than age 62, get at least 1,200 mg of calcium and at least 800 international units (IU) of vitamin D per day. Smoking and drinking excessive alcohol increase the risk of osteoporosis. Eat foods that are rich in calcium and vitamin D, and do weight-bearing exercises several times each week as directed by your health care provider. How does menopause affect my mental health? Depression may occur at any age, but it is more common as you become older. Common symptoms of depression include: Feeling depressed. Changes in sleep patterns. Changes in appetite or eating patterns. Feeling an overall lack of motivation or enjoyment of activities that you previously enjoyed. Frequent crying spells. Talk with your health care provider if you think that you are experiencing any of these symptoms. General instructions See your health care provider for regular wellness exams and vaccines. This may include: Scheduling regular health, dental, and eye exams. Getting and maintaining your vaccines. These include: Influenza vaccine. Get this vaccine each year before the flu season begins. Pneumonia vaccine. Shingles vaccine. Tetanus, diphtheria, and pertussis (Tdap) booster vaccine. Your health care provider may also recommend other immunizations. Tell your health care provider if you have ever been abused or do not feel safe at home. Summary Menopause is a normal process in which your ability to get pregnant comes to an end. This condition causes hot flashes, night sweats, decreased interest in sex, mood swings, headaches, or lack  of sleep. Treatment for this condition may include hormone replacement therapy. Take actions to keep yourself healthy, including exercising regularly, eating a healthy diet, watching your weight, and checking your blood pressure and blood sugar levels. Get screened for cancer and depression. Make sure that you are up to date with all your vaccines. This information is not intended to replace advice given to you by your health care provider. Make sure you discuss any questions you have with your health care provider. Document Revised: 09/19/2020 Document Reviewed: 09/19/2020 Elsevier Patient Education  2024 [employer].

## 2023-11-27 ENCOUNTER — Ambulatory Visit: Payer: Self-pay | Admitting: Internal Medicine

## 2023-11-27 LAB — CBC
HCT: 38.8 % (ref 35.0–45.0)
Hemoglobin: 12.6 g/dL (ref 11.7–15.5)
MCH: 31.3 pg (ref 27.0–33.0)
MCHC: 32.5 g/dL (ref 32.0–36.0)
MCV: 96.3 fL (ref 80.0–100.0)
MPV: 12.9 fL — ABNORMAL HIGH (ref 7.5–12.5)
Platelets: 348 Thousand/uL (ref 140–400)
RBC: 4.03 Million/uL (ref 3.80–5.10)
RDW: 13.2 % (ref 11.0–15.0)
WBC: 6.9 Thousand/uL (ref 3.8–10.8)

## 2023-11-27 LAB — COMPREHENSIVE METABOLIC PANEL WITH GFR
AG Ratio: 1.4 (calc) (ref 1.0–2.5)
ALT: 21 U/L (ref 6–29)
AST: 19 U/L (ref 10–35)
Albumin: 4 g/dL (ref 3.6–5.1)
Alkaline phosphatase (APISO): 81 U/L (ref 37–153)
BUN/Creatinine Ratio: 22 (calc) (ref 6–22)
BUN: 10 mg/dL (ref 7–25)
CO2: 26 mmol/L (ref 20–32)
Calcium: 9.1 mg/dL (ref 8.6–10.4)
Chloride: 105 mmol/L (ref 98–110)
Creat: 0.45 mg/dL — ABNORMAL LOW (ref 0.50–1.03)
Globulin: 2.8 g/dL (ref 1.9–3.7)
Glucose, Bld: 83 mg/dL (ref 65–99)
Potassium: 4.4 mmol/L (ref 3.5–5.3)
Sodium: 137 mmol/L (ref 135–146)
Total Bilirubin: 1 mg/dL (ref 0.2–1.2)
Total Protein: 6.8 g/dL (ref 6.1–8.1)
eGFR: 113 mL/min/1.73m2 (ref >=60)

## 2023-11-27 LAB — HEMOGLOBIN A1C
Hgb A1c MFr Bld: 5.6 % (ref <5.7)
Mean Plasma Glucose: 114 mg/dL
eAG (mmol/L): 6.3 mmol/L

## 2023-11-27 LAB — LIPID PANEL
Cholesterol: 185 mg/dL (ref <200)
HDL: 48 mg/dL — ABNORMAL LOW (ref >=50)
LDL Cholesterol (Calc): 111 mg/dL — ABNORMAL HIGH
Non-HDL Cholesterol (Calc): 137 mg/dL — ABNORMAL HIGH (ref <130)
Total CHOL/HDL Ratio: 3.9 (calc) (ref <5.0)
Triglycerides: 151 mg/dL — ABNORMAL HIGH (ref <150)

## 2023-11-27 NOTE — Telephone Encounter (Signed)
 noted

## 2023-11-28 ENCOUNTER — Other Ambulatory Visit (HOSPITAL_COMMUNITY): Payer: Self-pay

## 2023-11-28 ENCOUNTER — Telehealth: Payer: Self-pay

## 2023-11-28 NOTE — Telephone Encounter (Signed)
 Pharmacy Patient Advocate Encounter   Received notification from CoverMyMeds that prior authorization for Phentermine -Topiramate  ER 3.75-23MG  er capsules is required/requested.   Insurance verification completed.   The patient is insured through [facility] .   Per test claim: PA required; PA submitted to above mentioned insurance via CoverMyMeds Key/confirmation #/EOC [facility] Status is pending

## 2023-11-29 ENCOUNTER — Other Ambulatory Visit (HOSPITAL_COMMUNITY): Payer: Self-pay

## 2023-12-09 NOTE — Telephone Encounter (Signed)
 Pharmacy Patient Advocate Encounter  Received notification from [facility] that Prior Authorization for Phentermine -Topiramate  ER 3.75-23MG  er capsules has been APPROVED from 11/28/2023 to 02/28/2024  Please note that approval is dependent on regular titration  PA #/Case ID/Reference #: [id number]

## 2024-03-02 ENCOUNTER — Ambulatory Visit: Admitting: Podiatry

## 2024-03-08 ENCOUNTER — Encounter: Payer: Self-pay | Admitting: Internal Medicine

## 2024-03-13 ENCOUNTER — Ambulatory Visit: Admitting: Internal Medicine

## 2024-03-13 ENCOUNTER — Encounter: Payer: Self-pay | Admitting: Internal Medicine

## 2024-03-13 VITALS — BP 110/68 | HR 94 | Temp 99.2°F | Ht 70.5 in | Wt 216.8 lb

## 2024-03-13 DIAGNOSIS — J069 Acute upper respiratory infection, unspecified: Secondary | ICD-10-CM | POA: Diagnosis not present

## 2024-03-13 DIAGNOSIS — E6609 Other obesity due to excess calories: Secondary | ICD-10-CM | POA: Diagnosis not present

## 2024-03-13 DIAGNOSIS — J3089 Other allergic rhinitis: Secondary | ICD-10-CM

## 2024-03-13 DIAGNOSIS — E66811 Obesity, class 1: Secondary | ICD-10-CM

## 2024-03-13 DIAGNOSIS — Z6830 Body mass index (BMI) 30.0-30.9, adult: Secondary | ICD-10-CM

## 2024-03-13 MED ORDER — PHENTERMINE-TOPIRAMATE ER 3.75-23 MG PO CP24: 1.0000 | ORAL_CAPSULE | Freq: Every day | ORAL | 0 refills | Status: DC

## 2024-03-13 NOTE — Progress Notes (Signed)
 Subjective:    Patient ID: Teresa Abbott, female    DOB: 1965-11-04, 58 y.o.   MRN: [id number]  HPI  Discussed the use of AI scribe software for clinical note transcription with the patient, who gave verbal consent to proceed.  Teresa Abbott is a 58 year old female who presents for weight management.  She is seeking assistance with weight management, currently weighing 216 pounds with a BMI of 30.67. Her target weight is 165 pounds, which she last achieved approximately 10 to 15 years ago. She was previously prescribed Qsymia , but the prescription was not filled due to pharmacy issues. She has not started any weight loss medications yet.  She has been experiencing a cough for about a week and a half. Initially, it seemed to improve but then worsened, with symptoms moving to her head and causing a scratchy throat. No headaches, but there is nasal congestion and a runny nose without any discharge. She experienced a slight fever the previous night. She works with third graders and attributes some symptoms to exposure to children. She tested negative for COVID-19 and does not believe she has strep throat, as her symptoms do not match her past experiences with strep.   Review of Systems     Past Medical History:  Diagnosis Date   Abnormal vaginal Pap smear    Arthritis    RIGHT WRIST   Clotting disorder 05/1989   ITP   History of ITP     Current Outpatient Medications  Medication Sig Dispense Refill   celecoxib  (CELEBREX ) 200 MG capsule Take 1 capsule (200 mg total) by mouth 2 (two) times daily. 60 capsule 3   Phentermine -Topiramate  ER 3.75-23 MG CP24 Take 1 capsule by mouth daily. 30 capsule 0   No current facility-administered medications for this visit.    No Known Allergies  Family History  Problem Relation Age of Onset   Breast cancer Mother 29   Cancer Mother    Dementia Father    Healthy Sister    Heart disease Brother    Alcohol abuse Brother     Social History    Socioeconomic History   Marital status: Married    Spouse name: Not on file   Number of children: Not on file   Years of education: Not on file   Highest education level: Master's degree (e.g., MA, MS, MEng, MEd, MSW, MBA)  Occupational History   Not on file  Tobacco Use   Smoking status: Former    Current packs/day: 0.00    Average packs/day: 0.5 packs/day for 8.0 years (4.0 ttl pk-yrs)    Types: Cigarettes    Start date: 04/18/1982    Quit date: 04/18/1990    Years since quitting: 33.9   Smokeless tobacco: Never  Vaping Use   Vaping status: Never Used  Substance and Sexual Activity   Alcohol use: Yes    Alcohol/week: 2.0 standard drinks of alcohol    Types: 2 Glasses of wine per week    Comment: OCC   Drug use: Never    Types: Marijuana   Sexual activity: Yes    Birth control/protection: Post-menopausal, None  Other Topics Concern   Not on file  Social History Narrative   Not on file   Social Drivers of Health   Financial Resource Strain: Low Risk  (11/25/2023)   Overall Financial Resource Strain (CARDIA)    Difficulty of Paying Living Expenses: Not hard at all  Food Insecurity: No Food Insecurity (11/25/2023)  Hunger Vital Sign    Worried About Running Out of Food in the Last Year: Never true    Ran Out of Food in the Last Year: Never true  Transportation Needs: No Transportation Needs (11/25/2023)   PRAPARE - Administrator, Civil Service (Medical): No    Lack of Transportation (Non-Medical): No  Physical Activity: Insufficiently Active (11/25/2023)   Exercise Vital Sign    Days of Exercise per Week: 1 day    Minutes of Exercise per Session: 30 min  Stress: No Stress Concern Present (11/25/2023)   [employer] of Occupational Health - Occupational Stress Questionnaire    Feeling of Stress: Only a little  Social Connections: Socially Integrated (11/25/2023)   Social Connection and Isolation Panel    Frequency of Communication with Friends and  Family: More than three times a week    Frequency of Social Gatherings with Friends and Family: Once a week    Attends Religious Services: More than 4 times per year    Active Member of [employer] or Organizations: Yes    Attends Engineer, Structural: More than 4 times per year    Marital Status: Married  Catering Manager Violence: Not on file     Constitutional: Patient reports difficulty losing weight.  Denies fever, malaise, fatigue, headache or abrupt weight changes.  HEENT: Pt reports runny nose, nasal congestion and sore throat. Denies eye pain, eye redness, ear pain, ringing in the ears, wax buildup, bloody nose. Respiratory: Patient reports cough.  Denies difficulty breathing, shortness of breath, or sputum production.   Cardiovascular: Denies chest pain, chest tightness, palpitations or swelling in the hands or feet.  Gastrointestinal: Denies abdominal pain, bloating, constipation, diarrhea or blood in the stool.  Musculoskeletal: Patient reports chronic back and hip pain.  Denies decrease in range of motion, difficulty with gait, muscle pain or joint swelling.  Skin: Denies redness, rashes, lesions or ulcercations.  Neurological: Denies dizziness, difficulty with memory, difficulty with speech or problems with balance and coordination.    No other specific complaints in a complete review of systems (except as listed in HPI above).  Objective:   Physical Exam  BP 110/68 (BP Location: Left Arm, Patient Position: Sitting, Cuff Size: Normal)   Pulse 94   Temp 99.2 F (37.3 C)   Ht 5' 10.5 (1.791 m)   Wt 216 lb 12.8 oz (98.3 kg)   LMP 05/09/2020   SpO2 96%   BMI 30.67 kg/m     Wt Readings from Last 3 Encounters:  11/26/23 218 lb 9.6 oz (99.2 kg)  11/20/22 209 lb (94.8 kg)  09/11/22 208 lb (94.3 kg)    General: Appears her stated age, obese, in NAD. Skin: Warm, dry and intact.   HEENT: Head: normal shape and size, no sinus pressure noted; Eyes: sclera white, no  icterus, conjunctiva pink, PERRLA and EOMs intact; Nose: mucosa boggy and moist, turbinates swollen; Throat: mucosa erythematous but moist, + PND, no tonsillar enlargement, exudate or lesions noted Neck: No adenopathy noted. Cardiovascular: Normal rate and rhythm. S1,S2 noted.  No murmur, rubs or gallops noted.  Pulmonary/Chest: Normal effort and positive vesicular breath sounds. No respiratory distress. No wheezes, rales or ronchi noted.  Musculoskeletal: No difficulty with gait.  Neurological: Alert and oriented.     BMET    Component Value Date/Time   NA 137 11/26/2023 0935   K 4.4 11/26/2023 0935   CL 105 11/26/2023 0935   CO2 26 11/26/2023 0935  GLUCOSE 83 11/26/2023 0935   BUN 10 11/26/2023 0935   CREATININE 0.45 (L) 11/26/2023 0935   CALCIUM 9.1 11/26/2023 0935   GFRNONAA 104 05/19/2020 0857   GFRAA 121 05/19/2020 0857    Lipid Panel     Component Value Date/Time   CHOL 185 11/26/2023 0935   TRIG 151 (H) 11/26/2023 0935   HDL 48 (L) 11/26/2023 0935   CHOLHDL 3.9 11/26/2023 0935   LDLCALC 111 (H) 11/26/2023 0935    CBC    Component Value Date/Time   WBC 6.9 11/26/2023 0935   RBC 4.03 11/26/2023 0935   HGB 12.6 11/26/2023 0935   HCT 38.8 11/26/2023 0935   PLT 348 11/26/2023 0935   MCV 96.3 11/26/2023 0935   MCH 31.3 11/26/2023 0935   MCHC 32.5 11/26/2023 0935   RDW 13.2 11/26/2023 0935   LYMPHSABS 2,328 05/19/2020 0857   EOSABS 171 05/19/2020 0857   BASOSABS 86 05/19/2020 0857    Hgb A1C Lab Results  Component Value Date   HGBA1C 5.6 11/26/2023           Assessment & Plan:   Assessment and Plan    Acute viral upper respiratory infection Likely viral etiology with possible allergy component. Negative COVID test. No strep throat evidence.  - Recommend Flonase OTC for nasal congestion. - She would like to avoid antihistamines as she reports this causes bladder issues  Allergic rhinitis Nasal inflammation and drainage, possibly worsened by  viral infection. Antihistamines linked to urinary symptoms. - Recommend Flonase OTC for nasal congestion.  Obesity BMI 30.67. Qsymia  not filled due to pharmacy issues. Discussed Qsymia  benefits for weight loss. Compared with less effective Contrave. Patient prefers pharmacy switch. - Rx for Qsymia  3.75-23 mg daily  - Instruct to take one pill once daily in the morning. - Advise not to take after 10 AM to avoid insomnia. - Monitor weight loss; contact if no weight loss after one month for dose adjustment.       RTC in 3 months for follow-up of chronic conditions Angeline Laura, NP

## 2024-03-13 NOTE — Patient Instructions (Signed)

## 2024-03-19 ENCOUNTER — Telehealth: Payer: Self-pay | Admitting: Pharmacy Technician

## 2024-03-19 ENCOUNTER — Other Ambulatory Visit (HOSPITAL_COMMUNITY): Payer: Self-pay

## 2024-03-19 NOTE — Telephone Encounter (Signed)
 Pharmacy Patient Advocate Encounter   Received notification from Onbase that prior authorization for Phentermine -Topiramate  ER 3.75-23MG  er capsules is required/requested.   Insurance verification completed.   The patient is insured through [facility].   Per test claim: PA required; PA submitted to above mentioned insurance via Latent Key/confirmation #/EOC [id number] Status is pending

## 2024-03-20 ENCOUNTER — Other Ambulatory Visit (HOSPITAL_COMMUNITY): Payer: Self-pay

## 2024-03-20 NOTE — Telephone Encounter (Signed)
 Pharmacy Patient Advocate Encounter  Received notification from [facility] that Prior Authorization for Phentermine -Topiramate  ER 3.75-23MG  er capsules has been APPROVED from 03/19/2024 to 06/19/2024. Ran test claim, Copay is $5.00. This test claim was processed through [facility]- copay amounts may vary at other pharmacies due to pharmacy/plan contracts, or as the patient moves through the different stages of their insurance plan.   PA #/Case ID/Reference #: [id number]

## 2024-03-30 ENCOUNTER — Ambulatory Visit: Admitting: Podiatry

## 2024-03-30 DIAGNOSIS — M722 Plantar fascial fibromatosis: Secondary | ICD-10-CM

## 2024-03-30 MED ORDER — TRIAMCINOLONE ACETONIDE 40 MG/ML IJ SUSP: 20.0000 mg | Freq: Once | INTRAMUSCULAR | Status: AC

## 2024-03-30 NOTE — Progress Notes (Signed)
 She presents today for flareup of her plantar fasciitis bilateral heels.  Objective: Vital signs are stable she is alert and oriented x 3 pain on palpation medial calcaneal tubercle bilateral.  Assessment: Plantar fasciitis reoccurrence bilateral.  Plan: I injected the bilateral heels today 20 mg Kenalog  5 mg Marcaine at the point of maximal tenderness.  She does not want to take any oral medication.  We are going to get her scheduled to meet with Sueanne for her orthotics.

## 2024-04-18 ENCOUNTER — Other Ambulatory Visit: Payer: Self-pay | Admitting: Internal Medicine

## 2024-04-21 ENCOUNTER — Other Ambulatory Visit

## 2024-04-21 NOTE — Telephone Encounter (Signed)
 Requested medications are due for refill today.  yes  Requested medications are on the active medications list.  yes  Last refill. 03/13/2024 #30 0 rf  Future visit scheduled.   yes  Notes to clinic.  Refill not delegated.   Requested Prescriptions  Pending Prescriptions Disp Refills   Phentermine -Topiramate  ER 3.75-23 MG CP24 [Pharmacy Med Name: PHENTERMN/TOPIRAMAT 3.75-23MG  ER CP] 30 capsule     Sig: Take 1 capsule by mouth daily.     Not Delegated - Neurology: Anticonvulsants - Controlled - phentermine  / topiramate  Failed - 04/21/2024 12:07 PM      Failed - This refill cannot be delegated      Failed - Cr in normal range and within 360 days    Creat  Date Value Ref Range Status  11/26/2023 0.45 (L) 0.50 - 1.03 mg/dL Final         Passed - CO2 in normal range and within 360 days    CO2  Date Value Ref Range Status  11/26/2023 26 20 - 32 mmol/L Final         Passed - ALT in normal range and within 360 days    ALT  Date Value Ref Range Status  11/26/2023 21 6 - 29 U/L Final         Passed - AST in normal range and within 360 days    AST  Date Value Ref Range Status  11/26/2023 19 10 - 35 U/L Final         Passed - Glucose (serum) in normal range and within 360 days    Glucose, Bld  Date Value Ref Range Status  11/26/2023 83 65 - 99 mg/dL Final    Comment:    .            Fasting reference interval .          Passed - K in normal range and within 360 days    Potassium  Date Value Ref Range Status  11/26/2023 4.4 3.5 - 5.3 mmol/L Final         Passed - Completed PHQ-2 or PHQ-9 in the last 360 days      Passed - Patient is not pregnant      Passed - Last BP in normal range    BP Readings from Last 1 Encounters:  03/13/24 110/68         Passed - Last Heart Rate in normal range    Pulse Readings from Last 1 Encounters:  03/13/24 94         Passed - Valid encounter within last 6 months    Recent Outpatient Visits           1 month ago Viral URI  with cough   [place] [facility], Angeline ORN, NP   4 months ago Encounter for general adult medical examination with abnormal findings   [place] [facility] [place], Angeline ORN, NP

## 2024-05-19 ENCOUNTER — Other Ambulatory Visit: Payer: Self-pay | Admitting: Internal Medicine

## 2024-05-20 NOTE — Telephone Encounter (Signed)
 Requested medication (s) are due for refill today: Yes  Requested medication (s) are on the active medication list: Yes  Last refill:  04/21/24  Future visit scheduled: Yes  Notes to clinic:  Not delegated.    Requested Prescriptions  Pending Prescriptions Disp Refills   Phentermine -Topiramate  ER 3.75-23 MG CP24 [Pharmacy Med Name: PHENTERMN/TOPIRAMAT 3.75-23MG  ER CP] 30 capsule     Sig: TAKE 1 CAPSULE BY MOUTH DAILY     Not Delegated - Neurology: Anticonvulsants - Controlled - phentermine  / topiramate  Failed - 05/20/2024 12:49 PM      Failed - This refill cannot be delegated      Failed - Cr in normal range and within 360 days    Creat  Date Value Ref Range Status  11/26/2023 0.45 (L) 0.50 - 1.03 mg/dL Final         Passed - CO2 in normal range and within 360 days    CO2  Date Value Ref Range Status  11/26/2023 26 20 - 32 mmol/L Final         Passed - ALT in normal range and within 360 days    ALT  Date Value Ref Range Status  11/26/2023 21 6 - 29 U/L Final         Passed - AST in normal range and within 360 days    AST  Date Value Ref Range Status  11/26/2023 19 10 - 35 U/L Final         Passed - Glucose (serum) in normal range and within 360 days    Glucose, Bld  Date Value Ref Range Status  11/26/2023 83 65 - 99 mg/dL Final    Comment:    .            Fasting reference interval .          Passed - K in normal range and within 360 days    Potassium  Date Value Ref Range Status  11/26/2023 4.4 3.5 - 5.3 mmol/L Final         Passed - Completed PHQ-2 or PHQ-9 in the last 360 days      Passed - Patient is not pregnant      Passed - Last BP in normal range    BP Readings from Last 1 Encounters:  03/13/24 110/68         Passed - Last Heart Rate in normal range    Pulse Readings from Last 1 Encounters:  03/13/24 94         Passed - Valid encounter within last 6 months    Recent Outpatient Visits           2 months ago Viral URI with cough    [place] [facility] [place], Angeline ORN, NP   5 months ago Encounter for general adult medical examination with abnormal findings   [place] [facility] [place], Angeline ORN, NP

## 2024-06-01 ENCOUNTER — Ambulatory Visit (INDEPENDENT_AMBULATORY_CARE_PROVIDER_SITE_OTHER): Admitting: Internal Medicine

## 2024-06-01 VITALS — BP 112/74 | Ht 70.5 in | Wt 197.6 lb

## 2024-06-01 DIAGNOSIS — F330 Major depressive disorder, recurrent, mild: Secondary | ICD-10-CM

## 2024-06-01 DIAGNOSIS — G8929 Other chronic pain: Secondary | ICD-10-CM

## 2024-06-01 DIAGNOSIS — M25552 Pain in left hip: Secondary | ICD-10-CM | POA: Diagnosis not present

## 2024-06-01 DIAGNOSIS — E663 Overweight: Secondary | ICD-10-CM | POA: Diagnosis not present

## 2024-06-01 DIAGNOSIS — E7800 Pure hypercholesterolemia, unspecified: Secondary | ICD-10-CM | POA: Diagnosis not present

## 2024-06-01 DIAGNOSIS — Z6827 Body mass index (BMI) 27.0-27.9, adult: Secondary | ICD-10-CM

## 2024-06-01 DIAGNOSIS — Z23 Encounter for immunization: Secondary | ICD-10-CM

## 2024-06-01 DIAGNOSIS — M5416 Radiculopathy, lumbar region: Secondary | ICD-10-CM

## 2024-06-01 NOTE — Patient Instructions (Signed)

## 2024-06-01 NOTE — Assessment & Plan Note (Signed)
 Continue phentermine -topiramate  ER 3.75-23 mg daily Reinforced low-carb diet and exercise for weight loss

## 2024-06-01 NOTE — Assessment & Plan Note (Signed)
 Encouraged regular stretching Okay to take tylenol  or ibuprofen OTC as needed

## 2024-06-01 NOTE — Assessment & Plan Note (Signed)
 Will check lipid profile annual exam Encouraged her to consume low-fat diet

## 2024-06-01 NOTE — Progress Notes (Signed)
 "  Subjective:    Patient ID: Teresa Abbott, female    DOB: Apr 11, 1966, 59 y.o.   MRN: [id number]  HPI  Pt presents to the clinic today for follow-up of chronic conditions.     Lumbar radiculopathy/chronic left hip pain: Improved with weight loss. She is not taking any medications for this.  She does not follow with orthopedics.   Depression (recurrent, mild): She is not currently taking any medications for this.  She is not currently seeing a therapist.  She denies anxiety, SI/HI.   HLD: Her last LDL was 111, triglycerides 151, 11/2023.  She is not taking cholesterol-lowering medication at this time.  She does not consume low-fat diet.  Review of Systems     Past Medical History:  Diagnosis Date   Abnormal vaginal Pap smear    Arthritis    RIGHT WRIST   Clotting disorder 05/1989   ITP   History of ITP     Current Outpatient Medications  Medication Sig Dispense Refill   Phentermine -Topiramate  ER 3.75-23 MG CP24 TAKE 1 CAPSULE BY MOUTH DAILY 30 capsule 0   Current Facility-Administered Medications  Medication Dose Route Frequency Provider Last Rate Last Admin   triamcinolone  acetonide (KENALOG -40) injection 20 mg  20 mg Other Once         No Known Allergies  Family History  Problem Relation Age of Onset   Breast cancer Mother 28   Cancer Mother    Dementia Father    Healthy Sister    Heart disease Brother    Alcohol abuse Brother     Social History   Socioeconomic History   Marital status: Married    Spouse name: Not on file   Number of children: Not on file   Years of education: Not on file   Highest education level: Master's degree (e.g., MA, MS, MEng, MEd, MSW, MBA)  Occupational History   Not on file  Tobacco Use   Smoking status: Former    Current packs/day: 0.00    Average packs/day: 0.5 packs/day for 8.0 years (4.0 ttl pk-yrs)    Types: Cigarettes    Start date: 04/18/1982    Quit date: 04/18/1990    Years since quitting: 34.1   Smokeless tobacco:  Never  Vaping Use   Vaping status: Never Used  Substance and Sexual Activity   Alcohol use: Yes    Alcohol/week: 2.0 standard drinks of alcohol    Types: 2 Glasses of wine per week    Comment: OCC   Drug use: Never    Types: Marijuana   Sexual activity: Yes    Birth control/protection: Post-menopausal, None  Other Topics Concern   Not on file  Social History Narrative   Not on file   Social Drivers of Health   Tobacco Use: Low Risk (03/13/2024)   Received from [facility]   Patient History    Smoking Tobacco Use: Never    Smokeless Tobacco Use: Never    Passive Exposure: Never  Recent Concern: Tobacco Use - Medium Risk (03/13/2024)   Patient History    Smoking Tobacco Use: Former    Smokeless Tobacco Use: Never    Passive Exposure: Not on file  Financial Resource Strain: Low Risk (06/01/2024)   Overall Financial Resource Strain (CARDIA)    Difficulty of Paying Living Expenses: Not hard at all  Food Insecurity: No Food Insecurity (06/01/2024)   Epic    Worried About Radiation Protection Practitioner of Food in the Last Year: Never  true    Ran Out of Food in the Last Year: Never true  Transportation Needs: No Transportation Needs (06/01/2024)   Epic    Lack of Transportation (Medical): No    Lack of Transportation (Non-Medical): No  Physical Activity: Insufficiently Active (06/01/2024)   Exercise Vital Sign    Days of Exercise per Week: 3 days    Minutes of Exercise per Session: 30 min  Stress: No Stress Concern Present (06/01/2024)   [employer] of Occupational Health - Occupational Stress Questionnaire    Feeling of Stress: Not at all  Social Connections: Socially Integrated (06/01/2024)   Social Connection and Isolation Panel    Frequency of Communication with Friends and Family: Three times a week    Frequency of Social Gatherings with Friends and Family: Once a week    Attends Religious Services: More than 4 times per year    Active Member of Clubs or Organizations: Yes     Attends Banker Meetings: More than 4 times per year    Marital Status: Married  Catering Manager Violence: Not on file  Depression (PHQ2-9): Medium Risk (03/13/2024)   Depression (PHQ2-9)    PHQ-2 Score: 5  Alcohol Screen: Low Risk (06/01/2024)   Alcohol Screen    Last Alcohol Screening Score (AUDIT): 2  Housing: Unknown (06/01/2024)   Epic    Unable to Pay for Housing in the Last Year: No    Number of Times Moved in the Last Year: Not on file    Homeless in the Last Year: No  Utilities: Not on file  Health Literacy: Not on file     Constitutional: Denies fever, malaise, fatigue, headache or abrupt weight changes.  HEENT: Denies eye pain, eye redness, ear pain, ringing in the ears, wax buildup, runny nose, nasal congestion, bloody nose, or sore throat. Respiratory: Denies difficulty breathing, shortness of breath, cough or sputum production.   Cardiovascular: Denies chest pain, chest tightness, palpitations or swelling in the hands or feet.  Gastrointestinal: Denies abdominal pain, bloating, constipation, diarrhea or blood in the stool.  GU: Denies urgency, frequency, pain with urination, burning sensation, blood in urine, odor or discharge. Musculoskeletal:  Denies decrease in range of motion, difficulty with gait, muscle pain or joint pain or swelling.  Skin: Denies redness, rashes, lesions or ulcercations.  Neurological: Denies dizziness, difficulty with memory, difficulty with speech or problems with balance and coordination.  Psych: Patient has a history of depression.  Denies anxiety, SI/HI.  No other specific complaints in a complete review of systems (except as listed in HPI above).  Objective:   Physical Exam BP 112/74 (BP Location: Right Arm, Patient Position: Sitting, Cuff Size: Normal)   Ht 5' 10.5 (1.791 m)   Wt 197 lb 9.6 oz (89.6 kg)   LMP 05/09/2020   BMI 27.95 kg/m     Wt Readings from Last 3 Encounters:  03/13/24 216 lb 12.8 oz (98.3 kg)   11/26/23 218 lb 9.6 oz (99.2 kg)  11/20/22 209 lb (94.8 kg)    General: Appears her stated age, obese, in NAD. HEENT: Head: normal shape and size; Eyes: sclera white, no icterus, conjunctiva pink, PERRLA and EOMs intact;  Cardiovascular: Normal rate and rhythm. S1,S2 noted.  No murmur, rubs or gallops noted.  Pulmonary/Chest: Normal effort and positive vesicular breath sounds. No respiratory distress. No wheezes, rales or ronchi noted.  Musculoskeletal: No difficulty with gait.  Neurological: Alert and oriented. Coordination normal.  Psychiatric: Mood and affect normal. Behavior  is normal. Judgment and thought content normal.     BMET    Component Value Date/Time   NA 137 11/26/2023 0935   K 4.4 11/26/2023 0935   CL 105 11/26/2023 0935   CO2 26 11/26/2023 0935   GLUCOSE 83 11/26/2023 0935   BUN 10 11/26/2023 0935   CREATININE 0.45 (L) 11/26/2023 0935   CALCIUM 9.1 11/26/2023 0935   GFRNONAA 104 05/19/2020 0857   GFRAA 121 05/19/2020 0857    Lipid Panel     Component Value Date/Time   CHOL 185 11/26/2023 0935   TRIG 151 (H) 11/26/2023 0935   HDL 48 (L) 11/26/2023 0935   CHOLHDL 3.9 11/26/2023 0935   LDLCALC 111 (H) 11/26/2023 0935    CBC    Component Value Date/Time   WBC 6.9 11/26/2023 0935   RBC 4.03 11/26/2023 0935   HGB 12.6 11/26/2023 0935   HCT 38.8 11/26/2023 0935   PLT 348 11/26/2023 0935   MCV 96.3 11/26/2023 0935   MCH 31.3 11/26/2023 0935   MCHC 32.5 11/26/2023 0935   RDW 13.2 11/26/2023 0935   LYMPHSABS 2,328 05/19/2020 0857   EOSABS 171 05/19/2020 0857   BASOSABS 86 05/19/2020 0857    Hgb A1C Lab Results  Component Value Date   HGBA1C 5.6 11/26/2023           Assessment & Plan:     RTC in 6 months for your annual exam Angeline Laura, NP  "

## 2024-06-01 NOTE — Assessment & Plan Note (Signed)
Stable off meds Support offered 

## 2024-12-03 ENCOUNTER — Encounter: Admitting: Internal Medicine
